# Patient Record
Sex: Female | Born: 1985 | Hispanic: Yes | Marital: Married | Smoking: Never smoker
Health system: Southern US, Community
[De-identification: ages and names within clinical notes are randomized; demographics above are authoritative.]

## PROBLEM LIST (undated history)

## (undated) ENCOUNTER — Inpatient Hospital Stay (HOSPITAL_COMMUNITY): Payer: Self-pay

## (undated) DIAGNOSIS — IMO0002 Reserved for concepts with insufficient information to code with codable children: Secondary | ICD-10-CM

## (undated) DIAGNOSIS — R7611 Nonspecific reaction to tuberculin skin test without active tuberculosis: Secondary | ICD-10-CM

## (undated) DIAGNOSIS — B159 Hepatitis A without hepatic coma: Secondary | ICD-10-CM

## (undated) DIAGNOSIS — Z8619 Personal history of other infectious and parasitic diseases: Secondary | ICD-10-CM

## (undated) HISTORY — DX: Reserved for concepts with insufficient information to code with codable children: IMO0002

## (undated) HISTORY — DX: Personal history of other infectious and parasitic diseases: Z86.19

## (undated) HISTORY — DX: Hepatitis a without hepatic coma: B15.9

## (undated) HISTORY — DX: Nonspecific reaction to tuberculin skin test without active tuberculosis: R76.11

---
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
## 2009-06-20 DIAGNOSIS — IMO0002 Reserved for concepts with insufficient information to code with codable children: Secondary | ICD-10-CM

## 2009-06-20 DIAGNOSIS — R87619 Unspecified abnormal cytological findings in specimens from cervix uteri: Secondary | ICD-10-CM

## 2009-06-20 HISTORY — DX: Unspecified abnormal cytological findings in specimens from cervix uteri: R87.619

## 2009-06-20 HISTORY — DX: Reserved for concepts with insufficient information to code with codable children: IMO0002

## 2011-05-06 ENCOUNTER — Other Ambulatory Visit: Payer: Self-pay | Admitting: Specialist

## 2011-05-06 ENCOUNTER — Ambulatory Visit
Admission: RE | Admit: 2011-05-06 | Discharge: 2011-05-06 | Disposition: A | Discharge status: Home / Self Care | Payer: No Typology Code available for payment source | Source: Ambulatory Visit | Attending: Specialist | Admitting: Specialist

## 2011-05-06 DIAGNOSIS — R6889 Other general symptoms and signs: Secondary | ICD-10-CM

## 2011-05-06 DIAGNOSIS — R7611 Nonspecific reaction to tuberculin skin test without active tuberculosis: Secondary | ICD-10-CM

## 2011-06-21 NOTE — L&D Delivery Note (Signed)
Delivery Note At 4:11 AM a viable female was delivered via Vaginal in birthing tub, Spontaneous Delivery (Presentation: ROA ). Nuchal cord x 1, delivered through and reduced after birth  APGAR: 8, 9; weight pending.  Pt assisted out of tub and into bed. Placenta status: Intact, Spontaneous by Veatrice Kells.  Cord: 3 vessels with the following complications: None.    Anesthesia: None  Episiotomy: None Lacerations: Hemostatic left labial abrasion Suture Repair: n/a Est. Blood Loss (mL): 200  Mom to postpartum.  Baby to nursery-stable.  Troyce Gieske E. 01/28/2012, 4:34 AM

## 2011-07-21 LAB — OB RESULTS CONSOLE RUBELLA ANTIBODY, IGM: Rubella: IMMUNE

## 2011-08-19 HISTORY — PX: COLPOSCOPY: SHX161

## 2011-08-29 ENCOUNTER — Other Ambulatory Visit (INDEPENDENT_AMBULATORY_CARE_PROVIDER_SITE_OTHER): Payer: BC Managed Care – PPO

## 2011-08-29 ENCOUNTER — Encounter: Payer: BC Managed Care – PPO | Admitting: Obstetrics and Gynecology

## 2011-08-29 DIAGNOSIS — Z1389 Encounter for screening for other disorder: Secondary | ICD-10-CM

## 2011-08-29 DIAGNOSIS — Z331 Pregnant state, incidental: Secondary | ICD-10-CM

## 2011-09-05 ENCOUNTER — Encounter (INDEPENDENT_AMBULATORY_CARE_PROVIDER_SITE_OTHER): Payer: BC Managed Care – PPO | Admitting: Obstetrics and Gynecology

## 2011-09-05 ENCOUNTER — Encounter (INDEPENDENT_AMBULATORY_CARE_PROVIDER_SITE_OTHER): Payer: BC Managed Care – PPO

## 2011-09-05 DIAGNOSIS — Z331 Pregnant state, incidental: Secondary | ICD-10-CM

## 2011-09-05 DIAGNOSIS — R87612 Low grade squamous intraepithelial lesion on cytologic smear of cervix (LGSIL): Secondary | ICD-10-CM

## 2011-09-26 ENCOUNTER — Other Ambulatory Visit: Payer: Self-pay

## 2011-09-26 DIAGNOSIS — Z3689 Encounter for other specified antenatal screening: Secondary | ICD-10-CM

## 2011-09-29 DIAGNOSIS — IMO0002 Reserved for concepts with insufficient information to code with codable children: Secondary | ICD-10-CM | POA: Insufficient documentation

## 2011-09-29 DIAGNOSIS — O26851 Spotting complicating pregnancy, first trimester: Secondary | ICD-10-CM

## 2011-09-29 DIAGNOSIS — N926 Irregular menstruation, unspecified: Secondary | ICD-10-CM | POA: Insufficient documentation

## 2011-09-29 DIAGNOSIS — R87619 Unspecified abnormal cytological findings in specimens from cervix uteri: Secondary | ICD-10-CM | POA: Insufficient documentation

## 2011-09-29 DIAGNOSIS — Z8659 Personal history of other mental and behavioral disorders: Secondary | ICD-10-CM

## 2011-09-30 ENCOUNTER — Ambulatory Visit (INDEPENDENT_AMBULATORY_CARE_PROVIDER_SITE_OTHER): Payer: BC Managed Care – PPO

## 2011-09-30 ENCOUNTER — Encounter: Payer: Self-pay | Admitting: Obstetrics and Gynecology

## 2011-09-30 ENCOUNTER — Other Ambulatory Visit: Payer: Self-pay | Admitting: Obstetrics and Gynecology

## 2011-09-30 ENCOUNTER — Ambulatory Visit (INDEPENDENT_AMBULATORY_CARE_PROVIDER_SITE_OTHER): Payer: BC Managed Care – PPO | Admitting: Obstetrics and Gynecology

## 2011-09-30 ENCOUNTER — Other Ambulatory Visit: Payer: BC Managed Care – PPO

## 2011-09-30 VITALS — BP 90/56 | Wt 164.0 lb

## 2011-09-30 DIAGNOSIS — Z3689 Encounter for other specified antenatal screening: Secondary | ICD-10-CM

## 2011-09-30 DIAGNOSIS — R7611 Nonspecific reaction to tuberculin skin test without active tuberculosis: Secondary | ICD-10-CM

## 2011-09-30 DIAGNOSIS — Z3400 Encounter for supervision of normal first pregnancy, unspecified trimester: Secondary | ICD-10-CM

## 2011-09-30 LAB — US OB FOLLOW UP

## 2011-09-30 NOTE — Progress Notes (Signed)
Doing well.  [place] today to complete heart views.  All WNL. Glucola NV, with HGB and RPR.   Attending waterbirth class next week. Reviewed colpo of 09/05/11. LGSIL on bx--per [facility], repeat pap in 6 months (9/13).

## 2011-10-28 ENCOUNTER — Ambulatory Visit (INDEPENDENT_AMBULATORY_CARE_PROVIDER_SITE_OTHER): Payer: BC Managed Care – PPO | Admitting: Obstetrics and Gynecology

## 2011-10-28 ENCOUNTER — Encounter: Payer: BC Managed Care – PPO | Admitting: Obstetrics and Gynecology

## 2011-10-28 ENCOUNTER — Encounter: Payer: Self-pay | Admitting: Obstetrics and Gynecology

## 2011-10-28 ENCOUNTER — Other Ambulatory Visit: Payer: BC Managed Care – PPO

## 2011-10-28 VITALS — BP 90/62 | Ht 62.0 in | Wt 168.0 lb

## 2011-10-28 DIAGNOSIS — IMO0002 Reserved for concepts with insufficient information to code with codable children: Secondary | ICD-10-CM

## 2011-10-28 DIAGNOSIS — R6889 Other general symptoms and signs: Secondary | ICD-10-CM

## 2011-10-28 DIAGNOSIS — Z3490 Encounter for supervision of normal pregnancy, unspecified, unspecified trimester: Secondary | ICD-10-CM

## 2011-10-28 LAB — CBC
HCT: 34.8 % — ABNORMAL LOW (ref 36.0–46.0)
MCH: 30.1 pg (ref 26.0–34.0)
MCHC: 32.8 g/dL (ref 30.0–36.0)
MCV: 91.8 fL (ref 78.0–100.0)
RDW: 12.9 % (ref 11.5–15.5)

## 2011-10-28 NOTE — Progress Notes (Signed)
No complaints except occ hip discomfort. Glucola today  B+ FKCs RTO 2wks

## 2011-11-11 ENCOUNTER — Ambulatory Visit (INDEPENDENT_AMBULATORY_CARE_PROVIDER_SITE_OTHER): Payer: BC Managed Care – PPO | Admitting: Obstetrics and Gynecology

## 2011-11-11 VITALS — BP 102/60 | Wt 170.0 lb

## 2011-11-11 DIAGNOSIS — Z3490 Encounter for supervision of normal pregnancy, unspecified, unspecified trimester: Secondary | ICD-10-CM

## 2011-11-11 NOTE — Progress Notes (Signed)
No complaints Reviewed and negative GCT Fetal kick count reviewed Patient inquiring about delivery plan and is interested in a water birth. Next appointment in 2 weeks with midwife

## 2011-11-11 NOTE — Progress Notes (Signed)
Pt has no complaints/ JM  

## 2011-11-21 DIAGNOSIS — R7611 Nonspecific reaction to tuberculin skin test without active tuberculosis: Secondary | ICD-10-CM

## 2011-11-21 DIAGNOSIS — Z3400 Encounter for supervision of normal first pregnancy, unspecified trimester: Secondary | ICD-10-CM

## 2011-11-24 ENCOUNTER — Encounter: Payer: BC Managed Care – PPO | Admitting: Obstetrics and Gynecology

## 2011-11-25 ENCOUNTER — Ambulatory Visit (INDEPENDENT_AMBULATORY_CARE_PROVIDER_SITE_OTHER): Payer: BC Managed Care – PPO | Admitting: Obstetrics and Gynecology

## 2011-11-25 VITALS — BP 100/60 | Wt 174.0 lb

## 2011-11-25 DIAGNOSIS — Z331 Pregnant state, incidental: Secondary | ICD-10-CM

## 2011-11-25 NOTE — Progress Notes (Signed)
Doing well.  Return office in 2 weeks. 

## 2011-11-25 NOTE — Progress Notes (Signed)
Pt states she has no concerns today. [facility] CMA

## 2011-12-12 ENCOUNTER — Ambulatory Visit (INDEPENDENT_AMBULATORY_CARE_PROVIDER_SITE_OTHER): Payer: BC Managed Care – PPO | Admitting: Obstetrics and Gynecology

## 2011-12-12 ENCOUNTER — Encounter: Payer: Self-pay | Admitting: Obstetrics and Gynecology

## 2011-12-12 VITALS — BP 90/64 | Wt 174.5 lb

## 2011-12-12 DIAGNOSIS — N949 Unspecified condition associated with female genital organs and menstrual cycle: Secondary | ICD-10-CM

## 2011-12-12 DIAGNOSIS — O26899 Other specified pregnancy related conditions, unspecified trimester: Secondary | ICD-10-CM

## 2011-12-12 DIAGNOSIS — O9989 Other specified diseases and conditions complicating pregnancy, childbirth and the puerperium: Secondary | ICD-10-CM

## 2011-12-12 DIAGNOSIS — Z331 Pregnant state, incidental: Secondary | ICD-10-CM

## 2011-12-12 LAB — POCT URINALYSIS DIPSTICK
Spec Grav, UA: <=1.005
Urobilinogen, UA: 1
pH, UA: 7

## 2011-12-12 NOTE — Progress Notes (Signed)
Doing well. Labor discussed. Return office in 2 weeks. Dr. Stefano Gaul

## 2011-12-30 ENCOUNTER — Ambulatory Visit (INDEPENDENT_AMBULATORY_CARE_PROVIDER_SITE_OTHER): Payer: BC Managed Care – PPO | Admitting: Obstetrics and Gynecology

## 2011-12-30 ENCOUNTER — Encounter: Payer: Self-pay | Admitting: Obstetrics and Gynecology

## 2011-12-30 VITALS — BP 102/60 | Wt 178.0 lb

## 2011-12-30 DIAGNOSIS — Z331 Pregnant state, incidental: Secondary | ICD-10-CM

## 2011-12-30 NOTE — Progress Notes (Signed)
No complaints. GBS today.  Declined GC/CT. Repeat pap PP Questions answered FKCs and Labor Precautions Desires waterbirth

## 2012-01-05 ENCOUNTER — Ambulatory Visit (INDEPENDENT_AMBULATORY_CARE_PROVIDER_SITE_OTHER): Payer: BC Managed Care – PPO | Admitting: Obstetrics and Gynecology

## 2012-01-05 ENCOUNTER — Encounter: Payer: Self-pay | Admitting: Obstetrics and Gynecology

## 2012-01-05 VITALS — BP 100/66 | Wt 180.0 lb

## 2012-01-05 DIAGNOSIS — E119 Type 2 diabetes mellitus without complications: Secondary | ICD-10-CM

## 2012-01-05 NOTE — Progress Notes (Signed)
No concerns today 

## 2012-01-05 NOTE — Progress Notes (Signed)
Doing well--still plans waterbirth. Vtx to [place]. Reviewed GBS negative.

## 2012-01-13 ENCOUNTER — Ambulatory Visit (INDEPENDENT_AMBULATORY_CARE_PROVIDER_SITE_OTHER): Payer: BC Managed Care – PPO | Admitting: Obstetrics and Gynecology

## 2012-01-13 ENCOUNTER — Encounter: Payer: Self-pay | Admitting: Obstetrics and Gynecology

## 2012-01-13 VITALS — BP 100/60 | Wt 183.0 lb

## 2012-01-13 DIAGNOSIS — Z3480 Encounter for supervision of other normal pregnancy, unspecified trimester: Secondary | ICD-10-CM

## 2012-01-13 NOTE — Patient Instructions (Signed)

## 2012-01-13 NOTE — Progress Notes (Signed)
Pt declined cervix check

## 2012-01-13 NOTE — Progress Notes (Signed)
A/P GBS negative Fetal kick counts reviewed Labor reviewed with pt All patients  questions answered 

## 2012-01-20 ENCOUNTER — Encounter: Payer: Self-pay | Admitting: Obstetrics and Gynecology

## 2012-01-20 ENCOUNTER — Ambulatory Visit (INDEPENDENT_AMBULATORY_CARE_PROVIDER_SITE_OTHER): Payer: BC Managed Care – PPO | Admitting: Obstetrics and Gynecology

## 2012-01-20 VITALS — BP 98/60 | Wt 184.0 lb

## 2012-01-20 DIAGNOSIS — R6889 Other general symptoms and signs: Secondary | ICD-10-CM

## 2012-01-20 DIAGNOSIS — IMO0002 Reserved for concepts with insufficient information to code with codable children: Secondary | ICD-10-CM

## 2012-01-20 NOTE — Progress Notes (Signed)
Pt lost mucous plug on Sunday. cx check

## 2012-01-20 NOTE — Progress Notes (Signed)
Planning waterbirth. Cervix FT, 50%, vtx, -1 Slightly narrow outlet noted. Labor s/s reviewed. Needs repeat pap in September 2013 per [facility] plan (hx colpo 3/13).

## 2012-01-27 ENCOUNTER — Encounter (HOSPITAL_COMMUNITY): Payer: Self-pay | Admitting: *Deleted

## 2012-01-27 ENCOUNTER — Inpatient Hospital Stay (HOSPITAL_COMMUNITY)
Admission: AD | Admit: 2012-01-27 | Discharge: 2012-01-30 | DRG: 373 | Disposition: A | Discharge status: Home / Self Care | Payer: BC Managed Care – PPO | Source: Ambulatory Visit | Attending: Obstetrics and Gynecology | Admitting: Obstetrics and Gynecology

## 2012-01-27 ENCOUNTER — Encounter: Payer: Self-pay | Admitting: Obstetrics and Gynecology

## 2012-01-27 ENCOUNTER — Ambulatory Visit (INDEPENDENT_AMBULATORY_CARE_PROVIDER_SITE_OTHER): Payer: BC Managed Care – PPO | Admitting: Obstetrics and Gynecology

## 2012-01-27 ENCOUNTER — Ambulatory Visit (INDEPENDENT_AMBULATORY_CARE_PROVIDER_SITE_OTHER): Payer: BC Managed Care – PPO

## 2012-01-27 VITALS — BP 98/66 | Wt 183.0 lb

## 2012-01-27 DIAGNOSIS — O480 Post-term pregnancy: Secondary | ICD-10-CM

## 2012-01-27 DIAGNOSIS — Z3400 Encounter for supervision of normal first pregnancy, unspecified trimester: Secondary | ICD-10-CM

## 2012-01-27 DIAGNOSIS — O368190 Decreased fetal movements, unspecified trimester, not applicable or unspecified: Secondary | ICD-10-CM

## 2012-01-27 DIAGNOSIS — R7611 Nonspecific reaction to tuberculin skin test without active tuberculosis: Secondary | ICD-10-CM

## 2012-01-27 LAB — CBC
Hemoglobin: 12.1 g/dL (ref 12.0–15.0)
MCH: 27.9 pg (ref 26.0–34.0)
MCHC: 33.2 g/dL (ref 30.0–36.0)
MCV: 84.3 fL (ref 78.0–100.0)
RBC: 4.33 MIL/uL (ref 3.87–5.11)

## 2012-01-27 LAB — OB RESULTS CONSOLE HIV ANTIBODY (ROUTINE TESTING): HIV: NONREACTIVE

## 2012-01-27 LAB — OB RESULTS CONSOLE ABO/RH: RH Type: POSITIVE

## 2012-01-27 LAB — OB RESULTS CONSOLE GC/CHLAMYDIA: Chlamydia: NEGATIVE

## 2012-01-27 LAB — OB RESULTS CONSOLE RPR: RPR: NONREACTIVE

## 2012-01-27 LAB — OB RESULTS CONSOLE HEPATITIS B SURFACE ANTIGEN: Hepatitis B Surface Ag: NEGATIVE

## 2012-01-27 MED ORDER — LACTATED RINGERS IV SOLN
500.0000 mL | INTRAVENOUS | Status: DC | PRN
  Administered 2012-01-28: 125 mL via INTRAVENOUS

## 2012-01-27 MED ORDER — CITRIC ACID-SODIUM CITRATE 334-500 MG/5ML PO SOLN: 30.0000 mL | ORAL | Status: DC | PRN

## 2012-01-27 MED ORDER — LIDOCAINE HCL (PF) 1 % IJ SOLN: 30.0000 mL | INTRAMUSCULAR | Status: DC | PRN

## 2012-01-27 MED ORDER — IBUPROFEN 600 MG PO TABS: 600.0000 mg | ORAL_TABLET | Freq: Four times a day (QID) | ORAL | Status: DC | PRN

## 2012-01-27 MED ORDER — ONDANSETRON HCL 4 MG/2ML IJ SOLN
4.0000 mg | Freq: Four times a day (QID) | INTRAMUSCULAR | Status: DC | PRN
  Administered 2012-01-28: 4 mg via INTRAVENOUS
  Filled 2012-01-27: qty 2

## 2012-01-27 MED ORDER — OXYCODONE-ACETAMINOPHEN 5-325 MG PO TABS: 1.0000 | ORAL_TABLET | ORAL | Status: DC | PRN

## 2012-01-27 MED ORDER — FLEET ENEMA 7-19 GM/118ML RE ENEM: 1.0000 | ENEMA | RECTAL | Status: DC | PRN

## 2012-01-27 MED ORDER — OXYTOCIN 40 UNITS IN LACTATED RINGERS INFUSION - SIMPLE MED
62.5000 mL/h | Freq: Once | INTRAVENOUS | Status: DC
  Filled 2012-01-27: qty 1000

## 2012-01-27 MED ORDER — OXYTOCIN BOLUS FROM INFUSION
250.0000 mL | Freq: Once | INTRAVENOUS | Status: DC
  Filled 2012-01-27: qty 500

## 2012-01-27 MED ORDER — ACETAMINOPHEN 325 MG PO TABS: 650.0000 mg | ORAL_TABLET | ORAL | Status: DC | PRN

## 2012-01-27 MED ORDER — NALBUPHINE SYRINGE 5 MG/0.5 ML: 5.0000 mg | INJECTION | INTRAMUSCULAR | Status: DC | PRN

## 2012-01-27 NOTE — H&P (Signed)
Isabella Watkins is a 26 y.o. female presenting for SROM at 1330, uc since 0600, denies vag bleeding, with +FM. History OB History    Grav Para Term Preterm Abortions TAB SAB Ect Mult Living   1              Past Medical History  Diagnosis Date  . H/O varicella   . H/O rubella   . Abnormal Pap smear 2011  . Hepatitis A 2000    after trip to [place]  . History of positive PPD, treatment status unknown   HX LSIL plan f/o pap pp Past Surgical History  Procedure Date  . Colposcopy 3-13   Family History: family history includes Anemia in her mother and COPD in her paternal grandfather. Social History:  reports that she has never smoked. She has never used smokeless tobacco. She reports that she does not drink alcohol or use illicit drugs.   Prenatal Transfer Tool  Maternal Diabetes: No Genetic Screening: Declined Maternal Ultrasounds/Referrals: Normal at 19 weeks posterior placenta Fetal Ultrasounds or other Referrals:  None Maternal Substance Abuse:  No Significant Maternal Medications:  None Significant Maternal Lab Results:  None Other Comments:  None  ROS    Last menstrual period 05/21/2011. Exam Physical Exam Calm, no distress, HEENT WNL grossly, lungs clear bilaterally, AP RRR, abd soft nt,no masses, not tympanic bowel sounds active, abdomen nontender, No edema lower legs Vag 3 80 -2 VTX +fern at office at 1530 appointment with D. Connye Burkitt, CNM  BPP 8/8  fhts LTV min uc q 3-5 mild to mod Prenatal labs: ABO, Rh:  B post Antibody:  neg Rubella:  NR RPR: NON REAC (05/10 1244)  HBsAg:   NR HIV:   neg GBS: NEGATIVE (07/12 1413)   Assessment/Plan: [redacted]w[redacted]d Routine admission, water birth, intermittent FHR, collaboration with Dr. Pennie Rushing.   Isabella Watkins 01/27/2012, 5:44 PM

## 2012-01-27 NOTE — Progress Notes (Signed)
  Subjective: Has been in tub since arrival on [employer]. Reports UCs are strong when they occur, but she is unsure of frequency.  Objective: BP 122/69  Pulse 89  Temp 98.2 F (36.8 C) (Oral)  Resp 18  Ht 5\' 2"  (1.575 m)  Wt 183 lb (83.008 kg)  BMI 33.47 kg/m2  LMP 05/21/2011      FHT:  Non-reactive at present, but no decels.  Had segment of reactivity after admission, and occasional scattered accels.  One episode of 2-3 mild variables around 6:40pm UC:   irregular, every 4- minutes SVE:      Labs: Lab Results  Component Value Date   WBC 10.6* 01/27/2012   HGB 12.1 01/27/2012   HCT 36.5 01/27/2012   MCV 84.3 01/27/2012   PLT 260 01/27/2012    Assessment / Plan: SROM at 1:30pm Early labor Negative GBS Plans waterbirth  Plan: Continuous monitoring Recommend out of tub and ambulation for 1 hour, then re-evaluate.  May need augmentation. Dr. Pennie Rushing updated.    Nigel Bridgeman 01/27/2012, 8:23 PM

## 2012-01-27 NOTE — Progress Notes (Signed)
Pt states having contractions since this AM every 10 mins lasting 30 secs each and increased mucous

## 2012-01-27 NOTE — Progress Notes (Signed)
[redacted]w[redacted]d NST this morning for decreased fetal movement. NST borderline remains Cat 1 variability min - mod. BPP scheduled for PM Patient returned at 3pm for BPP. States that had ? SROM at 1.30 pm and has started contracting BPP 8/8, low fluid volume. Called [facility]- transfer to hospital. Report given on patient status.  Speculum examination: Pooling, Ferning: Pos+

## 2012-01-27 NOTE — Progress Notes (Signed)
Manfred Arch, CNM at bedside, plan of care discussed with pt and pt's family

## 2012-01-28 ENCOUNTER — Encounter (HOSPITAL_COMMUNITY): Payer: Self-pay | Admitting: *Deleted

## 2012-01-28 MED ORDER — TETANUS-DIPHTH-ACELL PERTUSSIS 5-2.5-18.5 LF-MCG/0.5 IM SUSP: 0.5000 mL | Freq: Once | INTRAMUSCULAR | Status: DC

## 2012-01-28 MED ORDER — ONDANSETRON HCL 4 MG/2ML IJ SOLN: 4.0000 mg | INTRAMUSCULAR | Status: DC | PRN

## 2012-01-28 MED ORDER — OXYTOCIN 40 UNITS IN LACTATED RINGERS INFUSION - SIMPLE MED
1.0000 m[IU]/min | INTRAVENOUS | Status: DC
  Administered 2012-01-28: 1 m[IU]/min via INTRAVENOUS

## 2012-01-28 MED ORDER — ONDANSETRON HCL 4 MG PO TABS: 4.0000 mg | ORAL_TABLET | ORAL | Status: DC | PRN

## 2012-01-28 MED ORDER — TERBUTALINE SULFATE 1 MG/ML IJ SOLN: 0.2500 mg | Freq: Once | INTRAMUSCULAR | Status: DC | PRN

## 2012-01-28 MED ORDER — SENNOSIDES-DOCUSATE SODIUM 8.6-50 MG PO TABS: 2.0000 | ORAL_TABLET | Freq: Every day | ORAL | Status: DC

## 2012-01-28 MED ORDER — WITCH HAZEL-GLYCERIN EX PADS: 1.0000 "application " | MEDICATED_PAD | CUTANEOUS | Status: DC | PRN

## 2012-01-28 MED ORDER — ZOLPIDEM TARTRATE 5 MG PO TABS: 5.0000 mg | ORAL_TABLET | Freq: Every evening | ORAL | Status: DC | PRN

## 2012-01-28 MED ORDER — PRENATAL MULTIVITAMIN CH
1.0000 | ORAL_TABLET | Freq: Every day | ORAL | Status: DC
  Administered 2012-01-28 – 2012-01-30 (×3): 1 via ORAL
  Filled 2012-01-28 (×3): qty 1

## 2012-01-28 MED ORDER — IBUPROFEN 600 MG PO TABS
600.0000 mg | ORAL_TABLET | Freq: Four times a day (QID) | ORAL | Status: DC
  Administered 2012-01-28 – 2012-01-30 (×10): 600 mg via ORAL
  Filled 2012-01-28 (×10): qty 1

## 2012-01-28 MED ORDER — SIMETHICONE 80 MG PO CHEW: 80.0000 mg | CHEWABLE_TABLET | ORAL | Status: DC | PRN

## 2012-01-28 MED ORDER — DIBUCAINE 1 % RE OINT
1.0000 "application " | TOPICAL_OINTMENT | RECTAL | Status: DC | PRN
  Filled 2012-01-28: qty 28

## 2012-01-28 MED ORDER — OXYCODONE-ACETAMINOPHEN 5-325 MG PO TABS: 1.0000 | ORAL_TABLET | ORAL | Status: DC | PRN

## 2012-01-28 MED ORDER — BUTORPHANOL TARTRATE 1 MG/ML IJ SOLN
1.0000 mg | INTRAMUSCULAR | Status: DC | PRN
  Administered 2012-01-28: 1 mg via INTRAVENOUS

## 2012-01-28 MED ORDER — DIPHENHYDRAMINE HCL 25 MG PO CAPS: 25.0000 mg | ORAL_CAPSULE | Freq: Four times a day (QID) | ORAL | Status: DC | PRN

## 2012-01-28 MED ORDER — LANOLIN HYDROUS EX OINT: TOPICAL_OINTMENT | CUTANEOUS | Status: DC | PRN

## 2012-01-28 MED ORDER — BENZOCAINE-MENTHOL 20-0.5 % EX AERO
1.0000 "application " | INHALATION_SPRAY | CUTANEOUS | Status: DC | PRN
  Filled 2012-01-28 (×2): qty 56

## 2012-01-28 MED ORDER — BUTORPHANOL TARTRATE 1 MG/ML IJ SOLN
INTRAMUSCULAR | Status: AC
  Filled 2012-01-28: qty 1

## 2012-01-28 NOTE — Progress Notes (Signed)
Transferred to 142 via wheelchair with infant and family members

## 2012-01-28 NOTE — Progress Notes (Signed)
  Subjective: Minimal benefit from Stadol.   Objective: BP 118/68  Pulse 74  Temp 97.8 F (36.6 C) (Axillary)  Resp 19  Ht 5\' 2"  (1.575 m)  Wt 183 lb (83.008 kg)  BMI 33.47 kg/m2  LMP 05/21/2011      FHT:  Reassuring, occasional quick variable. UC:   regular, every 3 minutes SVE:  Cervix 6 cm, 100%, vtx, -1--still narrow outlet.   Assessment / Plan: Slow labor progression. Recommended pitocin augmentation, with pain management prn. Patient agreeable with pitocin, desires return to tub for pain management.   Furman Trentman 01/28/2012, 2:11 AM

## 2012-01-28 NOTE — Progress Notes (Signed)
  Subjective: Crying with contractions, feeling pressure.  Currently in tub.  Objective: BP 118/75  Pulse 75  Temp 98.2 F (36.8 C) (Oral)  Resp 20  Ht 5\' 2"  (1.575 m)  Wt 183 lb (83.008 kg)  BMI 33.47 kg/m2  LMP 05/21/2011      FHT: Reassuring, occasional quick, mild variables UC:   Difficult to trace with telemetry monitor, but q 3 min by observation, moderate quality. SVE:   Dilation: 4 Effacement (%): 100 Station: -1 Exam by:: Manfred Arch, CNM No change from last exam. ? Rigidity of cervical tissue    Assessment / Plan: Prolonged prodromal phase of labor Probable inadequate contractions.  Plan: Reviewed status with patient and family. Recommend IV pain medication now, with re-evaluation in 1-2 hours.  If no progress, will recommend pitocin augmentation, with epidural as option for pain management.   Nigel Bridgeman 01/28/2012, 12:43 AM

## 2012-01-29 NOTE — Progress Notes (Signed)
Post Partum Day 1 Subjective: no complaints, up ad lib, voiding, tolerating PO, + flatus and BF'ng well so far; desires inpatient circ.  Pt sleeping on my arrival to room and s.o. in bedside asleep beside her.VB lighter today. Reports "sore" in places she didn't anticipate being sore from labor & birth.  Objective: Blood pressure 112/71, pulse 82, temperature 97.6 F (36.4 C), temperature source Oral, resp. rate 18, height 5\' 2"  (1.575 m), weight 183 lb (83.008 kg), last menstrual period 05/21/2011, SpO2 97.00%, unknown if currently breastfeeding.  Physical Exam:  General: alert, cooperative, fatigued and no distress Lochia: appropriate, rubra Uterine Fundus: firm, u/-1 Incision: n/a DVT Evaluation: No evidence of DVT seen on physical exam. Negative Homan's sign.   Basename 01/27/12 1755  HGB 12.1  HCT 36.5    Assessment/Plan: Plan for discharge tomorrow, Breastfeeding and Circumcision prior to discharge S/p waterbirth.     LOS: 2 days   Isabella Watkins H 01/29/2012, 10:35 AM

## 2012-01-30 MED ORDER — IBUPROFEN 600 MG PO TABS: 600.0000 mg | ORAL_TABLET | Freq: Four times a day (QID) | ORAL | Status: AC | PRN

## 2012-01-30 NOTE — Discharge Summary (Signed)
Obstetric Discharge Summary Reason for Admission: rupture of membranes, early labor Prenatal Procedures: NST and ultrasound Intrapartum Procedures: spontaneous vaginal delivery, waterbirth, pitocin augmentation Postpartum Procedures: none Complications-Operative and Postpartum: none Hemoglobin  Date Value Range Status  01/27/2012 12.1  12.0 - 15.0 g/dL Final     HCT  Date Value Range Status  01/27/2012 36.5  36.0 - 46.0 % Final   Hospital Course: Admitted 01/27/12 with SROM. Negative GBS. Planned waterbirth, and utilized tub at intervals during labor.  Pitocin augmentation was initiated for slow progress in labor, and patient utilized IV pain medication twice for pain management. Labor then progressed well, with delivery was performed by Maylon Cos, CNM, in stand-by for Nigel Bridgeman, CNM, who was in another delivery at the same time. Patient delivered without complication. Patient and baby tolerated the procedure without difficulty, with  no laceration noted--she had a small abrasion of the left labia that did not require repair. Infant to FTN. Mother and infant then had an uncomplicated postpartum course, with breast feeding going well. Mom's physical exam was WNL, and she was discharged home in stable condition on 01/30/12.Marland Kitchen Contraception plan was undecided at the time of discharge.  She received adequate benefit from po pain medications and was using Motrin only with benefit.    Physical Exam:  General: alert Lochia: appropriate Uterine Fundus: firm Incision: No lacerations noted--small abrasion of left labia, but no repair required. DVT Evaluation: No evidence of DVT seen on physical exam. Negative Homan's sign.  Discharge Diagnoses: Term Pregnancy-delivered, waterbirth  Discharge Information: Date: 01/30/2012 Activity: Per [facility] handout Diet: routine Medications: Ibuprofen Condition: stable Instructions: refer to practice specific booklet Discharge to: home Follow-up Information     Follow up with [facility] in 6 weeks. (Call to schedule appointment or as needed)          Newborn Data: Live born female  Birth Weight: 6 lb 6.3 oz (2900 g) APGAR: 8, 9  Home with mother.  Nigel Bridgeman 01/30/2012, 10:57 AM

## 2012-02-03 ENCOUNTER — Telehealth: Payer: Self-pay | Admitting: Obstetrics and Gynecology

## 2012-02-03 NOTE — Telephone Encounter (Signed)
Spoke with pt del 01/27/12 rash on abdomen spreading to thighs tried hydrocortizone and benadryl no relief offered appt pt has appt 02/06/12 at 10:00 with Vl pt voice understanding

## 2012-02-06 ENCOUNTER — Encounter: Payer: Self-pay | Admitting: Obstetrics and Gynecology

## 2012-02-06 ENCOUNTER — Ambulatory Visit (INDEPENDENT_AMBULATORY_CARE_PROVIDER_SITE_OTHER): Payer: BC Managed Care – PPO | Admitting: Obstetrics and Gynecology

## 2012-02-06 ENCOUNTER — Other Ambulatory Visit: Payer: Self-pay | Admitting: Obstetrics and Gynecology

## 2012-02-06 VITALS — BP 100/66 | Resp 16 | Wt 173.0 lb

## 2012-02-06 DIAGNOSIS — L259 Unspecified contact dermatitis, unspecified cause: Secondary | ICD-10-CM

## 2012-02-06 DIAGNOSIS — L309 Dermatitis, unspecified: Secondary | ICD-10-CM | POA: Insufficient documentation

## 2012-02-06 NOTE — Progress Notes (Signed)
C/O onset itchy rash since delivery on 01/27/11.   First noted in stretch marks on abdomen, but now has extended to inner thighs, upper arms, breasts. Has used Benadryl and hydrocortisone cream with some relief.  No known dermatological exposures, no current meds. No fever or viral symptoms. No one else in family with similar sx.  PE: Erythematous papular rash in striae on abdomen, in patches between thighs, and scattered on breasts. No pustules or drainage noted.  Plan: Rx Medrol 4 mg dose pak over 6 days Triamcinolone 0.025% cream to area BID (Rxs called to patiient's pharmacy) Office will call patient for update on Thursday--if no improvement, will send to dermatologist.

## 2012-02-06 NOTE — Progress Notes (Signed)
Pt states every since vag. delivery 01/27/12 c/o itchy rash on abdominal area spreading down to both legs. Pt states has short term relief with hydrocortisone & Benadryl

## 2012-02-06 NOTE — Telephone Encounter (Signed)
Tc to pt regarding msg.  Advised pt to call pharmacy again to see if rx is there.  Per VL note rx was called in today.  Pt advised to call back if not there.  Pt voices understanding.

## 2012-02-08 ENCOUNTER — Telehealth: Payer: Self-pay

## 2012-02-08 NOTE — Telephone Encounter (Signed)
Message copied by Janeece Agee on Wed Feb 08, 2012  4:39 PM ------      Message from: Cornelius Moras      Created: Mon Feb 06, 2012  2:07 PM      Regarding: F/U call on Thursday       Please call patient on Thursday this week and check on the status of her rash.  If no improvement, needs referral to dermatologist ASAP.            Thanks!  See me if you have questions that day.            VL

## 2012-02-08 NOTE — Telephone Encounter (Signed)
Spoke with pt to follow up with her rash. Pt states she has 3 more days worth of pills left and the rash is improving. Informed pt to c/b if her sx's reverse or if she has any other concerns. Pt agrees and voices understanding.

## 2012-03-16 ENCOUNTER — Ambulatory Visit (INDEPENDENT_AMBULATORY_CARE_PROVIDER_SITE_OTHER): Payer: BC Managed Care – PPO | Admitting: Obstetrics and Gynecology

## 2012-03-16 ENCOUNTER — Encounter: Payer: Self-pay | Admitting: Obstetrics and Gynecology

## 2012-03-16 VITALS — BP 104/60 | Temp 97.7°F | Resp 16 | Ht 62.0 in | Wt 163.0 lb

## 2012-03-16 DIAGNOSIS — IMO0002 Reserved for concepts with insufficient information to code with codable children: Secondary | ICD-10-CM

## 2012-03-16 DIAGNOSIS — IMO0001 Reserved for inherently not codable concepts without codable children: Secondary | ICD-10-CM

## 2012-03-16 DIAGNOSIS — Z309 Encounter for contraceptive management, unspecified: Secondary | ICD-10-CM

## 2012-03-16 DIAGNOSIS — R87612 Low grade squamous intraepithelial lesion on cytologic smear of cervix (LGSIL): Secondary | ICD-10-CM

## 2012-03-16 MED ORDER — NORETHINDRONE 0.35 MG PO TABS: 1.0000 | ORAL_TABLET | Freq: Every day | ORAL | Status: DC

## 2012-03-16 NOTE — Progress Notes (Signed)
Date of delivery 01/28/2012 Female Name: Isabella Watkins Vaginal delivery:yes Cesarean section:no Tubal ligation:no GDM:no Breast Feeding:yes Bottle Feeding:no Post-Partum Blues:no Abnormal pap:yes needs repeat pap today. Colpo  In 08/2011 showed LGSIL Normal GU function: yes Normal GI function:yes Returning to work:no  Reports pain with BM only.  Reviewed BC options.  Filed Vitals:   03/16/12 1121  BP: 104/60  Temp: 97.7 F (36.5 C)  Resp: 16   ROS: noncontributory  Pelvic exam:  VULVA: normal appearing vulva with no masses, tenderness or lesions,  VAGINA: normal appearing vagina with normal color and discharge, no lesions, CERVIX: normal appearing cervix without discharge or lesions,  UTERUS: uterus is normal size, shape, consistency and nontender,  ADNEXA: normal adnexa in size, nontender and no masses.  A/P Pap today  Deciding on [facility] - decided on micronor (still BF) - Rx sent Rec stool softeners and obs over next couple of mths and if persists return to office for eval - rectal not spec done bc pt mentioned it after the exam and was hesitant about having it done today. Rpt pap and AEX in 

## 2012-03-20 LAB — PAP IG W/ RFLX HPV ASCU

## 2013-02-21 LAB — OB RESULTS CONSOLE ABO/RH: RH Type: POSITIVE

## 2013-02-21 LAB — OB RESULTS CONSOLE HEPATITIS B SURFACE ANTIGEN: HEP B S AG: NEGATIVE

## 2013-02-21 LAB — OB RESULTS CONSOLE HIV ANTIBODY (ROUTINE TESTING): HIV: NONREACTIVE

## 2013-02-21 LAB — OB RESULTS CONSOLE GC/CHLAMYDIA
CHLAMYDIA, DNA PROBE: NEGATIVE
GC PROBE AMP, GENITAL: NEGATIVE

## 2013-02-21 LAB — OB RESULTS CONSOLE RPR: RPR: NONREACTIVE

## 2013-02-21 LAB — OB RESULTS CONSOLE ANTIBODY SCREEN: ANTIBODY SCREEN: NEGATIVE

## 2013-02-21 LAB — OB RESULTS CONSOLE RUBELLA ANTIBODY, IGM: RUBELLA: IMMUNE

## 2013-06-20 NOTE — L&D Delivery Note (Signed)
Delivery Note  Pt had SROM for lg amt clear fluid at 2041 while in MAU, VE had been 3cm, pt then ambulated to L&D and upon arrival began to have urge to push, pt on hands/knees and vtx crowning when I arrived in room, FHR reassuring   At 10:02 PM a viable female was delivered via Vaginal, Spontaneous Delivery (Presentation: Right Occiput Anterior).  w compound L hand presentation, shoulders delivered easily, infant dried and placed on mom's abdomen, APGAR: 8, 9; weight 6 lb 13.2 oz (3096 g).   Placenta status: Intact, Expressed.  Cord: 3 vessels with the following complications: None.  Cord pH: n/a  Anesthesia: None  Episiotomy: None Lacerations: sm L periurethral laceration hemostatic not requiring repair  Suture Repair: n/a Est. Blood Loss (mL): 200  Mom to postpartum.  Baby to Couplet care / Skin to Skin. Pt plans to BF Desires inpatient circumcision  Routine PP orders except for will cancel AM CBC unless increased bleeding or symptomatic, (minimal blood loss and CBC not drawn until about 11pm, after delivery)   Malissa HippoShelley M Jinx Gilden 09/29/2013, 7:17 AM

## 2013-08-28 LAB — OB RESULTS CONSOLE GBS: GBS: NEGATIVE

## 2013-09-28 ENCOUNTER — Inpatient Hospital Stay (HOSPITAL_COMMUNITY)
Admission: AD | Admit: 2013-09-28 | Discharge: 2013-09-30 | DRG: 775 | Disposition: A | Discharge status: Home / Self Care | Payer: BC Managed Care – PPO | Source: Ambulatory Visit | Attending: Obstetrics and Gynecology | Admitting: Obstetrics and Gynecology

## 2013-09-28 ENCOUNTER — Encounter (HOSPITAL_COMMUNITY): Payer: Self-pay | Admitting: *Deleted

## 2013-09-28 DIAGNOSIS — Z91040 Latex allergy status: Secondary | ICD-10-CM | POA: Diagnosis present

## 2013-09-28 DIAGNOSIS — Z9289 Personal history of other medical treatment: Secondary | ICD-10-CM | POA: Diagnosis not present

## 2013-09-28 DIAGNOSIS — Z886 Allergy status to analgesic agent status: Secondary | ICD-10-CM

## 2013-09-28 DIAGNOSIS — O328XX0 Maternal care for other malpresentation of fetus, not applicable or unspecified: Secondary | ICD-10-CM | POA: Diagnosis present

## 2013-09-28 DIAGNOSIS — Z8669 Personal history of other diseases of the nervous system and sense organs: Secondary | ICD-10-CM

## 2013-09-28 LAB — CBC
HEMATOCRIT: 33.5 % — AB (ref 36.0–46.0)
Hemoglobin: 11.2 g/dL — ABNORMAL LOW (ref 12.0–15.0)
MCH: 27.4 pg (ref 26.0–34.0)
MCHC: 33.4 g/dL (ref 30.0–36.0)
MCV: 81.9 fL (ref 78.0–100.0)
Platelets: 228 10*3/uL (ref 150–400)
RBC: 4.09 MIL/uL (ref 3.87–5.11)
RDW: 14.8 % (ref 11.5–15.5)
WBC: 12 10*3/uL — ABNORMAL HIGH (ref 4.0–10.5)

## 2013-09-28 MED ORDER — ACETAMINOPHEN 325 MG PO TABS: 650.0000 mg | ORAL_TABLET | ORAL | Status: DC | PRN

## 2013-09-28 MED ORDER — CITRIC ACID-SODIUM CITRATE 334-500 MG/5ML PO SOLN: 30.0000 mL | ORAL | Status: DC | PRN

## 2013-09-28 MED ORDER — OXYTOCIN 10 UNIT/ML IJ SOLN: 10.0000 [IU] | Freq: Once | INTRAMUSCULAR | Status: DC

## 2013-09-28 MED ORDER — OXYTOCIN 10 UNIT/ML IJ SOLN
INTRAMUSCULAR | Status: AC
  Filled 2013-09-28: qty 1

## 2013-09-28 MED ORDER — LIDOCAINE HCL (PF) 1 % IJ SOLN
30.0000 mL | INTRAMUSCULAR | Status: DC | PRN
  Filled 2013-09-28: qty 30

## 2013-09-28 MED ORDER — OXYCODONE-ACETAMINOPHEN 5-325 MG PO TABS: 1.0000 | ORAL_TABLET | ORAL | Status: DC | PRN

## 2013-09-28 MED ORDER — ZOLPIDEM TARTRATE 10 MG PO TABS: 10.0000 mg | ORAL_TABLET | Freq: Every evening | ORAL | Status: DC | PRN

## 2013-09-28 MED ORDER — ONDANSETRON HCL 4 MG/2ML IJ SOLN: 4.0000 mg | Freq: Four times a day (QID) | INTRAMUSCULAR | Status: DC | PRN

## 2013-09-28 MED ORDER — LIDOCAINE HCL (PF) 1 % IJ SOLN
INTRAMUSCULAR | Status: AC
  Filled 2013-09-28: qty 30

## 2013-09-28 NOTE — MAU Provider Note (Signed)
History   28 yo G2P1001 at 40 1/7 weeks presented after calling to report UCs since 1am, with increase in intensity last 1-2 hours.  Reports +FM and small amount bloody show, no leaking.  No recent cervical exam.  Planning wateribirth--had waterbirth 01/2012.  Accompanied by husband and doula, Delfin EdisJessica Pace, and doula trainee, [place].  Patient Active Problem List   Diagnosis Date Noted  . 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 09/28/2013  . 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 History of seizures as a child 09/28/2013  . Dermatitis 02/06/2012  . Abnormal Pap smear 09/29/2011  . Irregular periods/menstrual cycles 09/29/2011    Chief Complaint  Patient presents with  . Labor Eval   HPI:  See above  OB History   Grav Para Term Preterm Abortions TAB SAB Ect Mult Living   2 1 1  0 0 0 0 0 0 1    2013--SVB, waterbirth, 40 5/7 weeks, 21 hour labor, 41 min 2nd stage, female, 6+6, female, delivered by Maylon CosSuzanne [place] for [employer]  Past Medical History  Diagnosis Date  . H/O varicella   . H/O rubella   . Abnormal Pap smear 2011  . Hepatitis A 2000    after trip to [place]  . History of positive PPD, treatment status unknown     Past Surgical History  Procedure Laterality Date  . Colposcopy  3-13    Family History  Problem Relation Age of Onset  . COPD Paternal Grandfather   . Anemia Mother     History  Substance Use Topics  . Smoking status: Never Smoker   . Smokeless tobacco: Never Used  . Alcohol Use: No    Allergies:  Allergies  Allergen Reactions  . Motrin [Ibuprofen] Rash  . Latex Rash    Prescriptions prior to admission  Medication Sig Dispense Refill  . Prenatal Vit-Fe Fumarate-FA (PRENATAL MULTIVITAMIN) TABS Take 1 tablet by mouth at bedtime.         ROS:  Contractions, +FM, bloody show Physical Exam   Blood pressure 117/74, pulse 70, resp. rate 18, height 5\' 2"  (1.575 m), weight 178 lb (80.74 kg).  Physical  Exam Chest clear Heart RRR without murmur Abd gravid, NT Pelvic--1-2 cm, 70%, vtx, -2, IBOW Ext WNL  FHR Category 1 UCs q 5-8 min, moderate    ED Course  IUP at 40 1/7 weeks ? Early vs prodromal labor GBS negative Planning waterbirth  Plan: Ambulate, then re-evaluate. Sanda KleinShelley Mace Weinberg, CNM, will re-evaluate.   Nigel BridgemanVicki Latham CNM, MSN 09/28/2013 6:24 PM  Addendum at 2000  S: pt back from walking, ctx about the same, has to breathe some through them, has had sm amt bloody show  O: vss, fhr not currently reactive but was cat 1 initially VE: some slight change, although diff examiner, 2.5-3/90/-2 -3 cervix slightly posterior  A: IUP at [redacted]w[redacted]d Early labor Declines interventions, planning WB  P: discussed options including staying for further observation or going home Offered pain meds pt declined Agrees to [place], but may not take it, Rx for 5 tabs 10mg  given Would like to go home  rv'd reasons to return and [facility] Enc to call w questions dc'd home in stable condition

## 2013-09-28 NOTE — MAU Note (Signed)
Pt presents with complaints of contractions that started at 1am. Denies any LOF, states some mucus discharge.

## 2013-09-29 ENCOUNTER — Encounter (HOSPITAL_COMMUNITY): Payer: Self-pay | Admitting: *Deleted

## 2013-09-29 LAB — CBC
HCT: 31.4 % — ABNORMAL LOW (ref 36.0–46.0)
Hemoglobin: 10.5 g/dL — ABNORMAL LOW (ref 12.0–15.0)
MCH: 27.5 pg (ref 26.0–34.0)
MCHC: 33.4 g/dL (ref 30.0–36.0)
MCV: 82.2 fL (ref 78.0–100.0)
Platelets: 227 10*3/uL (ref 150–400)
RBC: 3.82 MIL/uL — AB (ref 3.87–5.11)
RDW: 14.9 % (ref 11.5–15.5)
WBC: 10.9 10*3/uL — ABNORMAL HIGH (ref 4.0–10.5)

## 2013-09-29 LAB — TYPE AND SCREEN
ABO/RH(D): B POS
Antibody Screen: NEGATIVE

## 2013-09-29 LAB — RPR

## 2013-09-29 LAB — ABO/RH: ABO/RH(D): B POS

## 2013-09-29 MED ORDER — BENZOCAINE-MENTHOL 20-0.5 % EX AERO
1.0000 "application " | INHALATION_SPRAY | CUTANEOUS | Status: DC | PRN
  Filled 2013-09-29: qty 56

## 2013-09-29 MED ORDER — ACETAMINOPHEN 325 MG PO TABS
650.0000 mg | ORAL_TABLET | ORAL | Status: DC | PRN
  Administered 2013-09-29 – 2013-09-30 (×8): 650 mg via ORAL
  Filled 2013-09-29 (×8): qty 2

## 2013-09-29 MED ORDER — ONDANSETRON HCL 4 MG/2ML IJ SOLN: 4.0000 mg | INTRAMUSCULAR | Status: DC | PRN

## 2013-09-29 MED ORDER — BISACODYL 10 MG RE SUPP: 10.0000 mg | Freq: Every day | RECTAL | Status: DC | PRN

## 2013-09-29 MED ORDER — MEASLES, MUMPS & RUBELLA VAC ~~LOC~~ INJ
0.5000 mL | INJECTION | Freq: Once | SUBCUTANEOUS | Status: DC
  Filled 2013-09-29: qty 0.5

## 2013-09-29 MED ORDER — WITCH HAZEL-GLYCERIN EX PADS: 1.0000 "application " | MEDICATED_PAD | CUTANEOUS | Status: DC | PRN

## 2013-09-29 MED ORDER — ONDANSETRON HCL 4 MG PO TABS: 4.0000 mg | ORAL_TABLET | ORAL | Status: DC | PRN

## 2013-09-29 MED ORDER — PRENATAL MULTIVITAMIN CH
1.0000 | ORAL_TABLET | Freq: Every day | ORAL | Status: DC
  Administered 2013-09-29 – 2013-09-30 (×2): 1 via ORAL
  Filled 2013-09-29: qty 1

## 2013-09-29 MED ORDER — TETANUS-DIPHTH-ACELL PERTUSSIS 5-2.5-18.5 LF-MCG/0.5 IM SUSP: 0.5000 mL | Freq: Once | INTRAMUSCULAR | Status: DC

## 2013-09-29 MED ORDER — FLEET ENEMA 7-19 GM/118ML RE ENEM: 1.0000 | ENEMA | Freq: Every day | RECTAL | Status: DC | PRN

## 2013-09-29 MED ORDER — MEDROXYPROGESTERONE ACETATE 150 MG/ML IM SUSP: 150.0000 mg | INTRAMUSCULAR | Status: DC | PRN

## 2013-09-29 MED ORDER — DIPHENHYDRAMINE HCL 25 MG PO CAPS: 25.0000 mg | ORAL_CAPSULE | Freq: Four times a day (QID) | ORAL | Status: DC | PRN

## 2013-09-29 MED ORDER — OXYCODONE-ACETAMINOPHEN 5-325 MG PO TABS
1.0000 | ORAL_TABLET | ORAL | Status: DC | PRN
  Filled 2013-09-29: qty 1

## 2013-09-29 MED ORDER — ZOLPIDEM TARTRATE 5 MG PO TABS: 5.0000 mg | ORAL_TABLET | Freq: Every evening | ORAL | Status: DC | PRN

## 2013-09-29 MED ORDER — DIBUCAINE 1 % RE OINT: 1.0000 "application " | TOPICAL_OINTMENT | RECTAL | Status: DC | PRN

## 2013-09-29 MED ORDER — SIMETHICONE 80 MG PO CHEW: 80.0000 mg | CHEWABLE_TABLET | ORAL | Status: DC | PRN

## 2013-09-29 MED ORDER — LANOLIN HYDROUS EX OINT: TOPICAL_OINTMENT | CUTANEOUS | Status: DC | PRN

## 2013-09-29 MED ORDER — SENNOSIDES-DOCUSATE SODIUM 8.6-50 MG PO TABS: 2.0000 | ORAL_TABLET | ORAL | Status: DC

## 2013-09-29 NOTE — Progress Notes (Signed)
Subjective: Postpartum Day 1: Vaginal delivery, no laceration Patient up ad lib, reports no syncope or dizziness. Feeding:  Breast Contraceptive plan:  Undecided Pleased with birth process, even though her labor advanced too fast for use of waterbirth tub.  Objective: Vital signs in last 24 hours: Temp:  [97.6 F (36.4 C)-98.4 F (36.9 C)] 97.6 F (36.4 C) (04/12 0626) Pulse Rate:  [64-123] 82 (04/12 0626) Resp:  [18-20] 18 (04/12 0626) BP: (88-139)/(49-122) 98/67 mmHg (04/12 0626) Weight:  [178 lb (80.74 kg)] 178 lb (80.74 kg) (04/11 1816)  Physical Exam:  General: alert Lochia: appropriate Uterine Fundus: firm Perineum: Intact perineum DVT Evaluation: No evidence of DVT seen on physical exam. Negative Homan's sign.    Recent Labs  09/28/13 2250 09/29/13 0725  HGB 11.2* 10.5*  HCT 33.5* 31.4*    Assessment/Plan: Status post vaginal delivery day 1. Stable Continue current care. Plan for discharge tomorrow    Nigel BridgemanVicki Avry Monte[place]one 09/29/2013, 9:21 AM

## 2013-09-29 NOTE — Lactation Note (Signed)
This note was copied from the chart of Boy Debroah Looprika Sansone. Lactation Consultation Note  Patient Name: Boy Debroah Looprika Evrard EAVWU'JToday's Date: 09/29/2013 Reason for consult: Initial assessment of this mom and baby at 24 hours postpartum.  Initial latch difficulty led RN during the night to assist using a #20 NS but mom states that baby is latching better now and she is not using NS. Mom is experienced with breastfeeding first baby for one year.  Mom has baby in sidelying position facing breast and reports that baby fed recently but is sleepy now.  LC encouraged STS and cue feedings. LC encouraged review of Baby and Me pp 9, 14 and 20-25 for STS and BF information. LC provided [employer] Resource brochure and reviewed [facility] and list of community and web site resources.  Mom had used a hand pump with her first child and asked for one to use as needed for this baby.  She states she is familiar with how to use and clean the Medela harmony pump.      Maternal Data Formula Feeding for Exclusion: No Infant to breast within first hour of birth: Yes (LATCH score=7 but baby sleepy at breast) Has patient been taught Hand Expression?: Yes Does the patient have breastfeeding experience prior to this delivery?: Yes  Feeding Feeding Type: Breast Fed Length of feed: 30 min  LATCH Score/Interventions            Most recent LATCH score was "7" per RN last night, with use of NS          Lactation Tools Discussed/Used     Consult Status Consult Status: Follow-up Date: 09/30/13 Follow-up type: In-patient    Zara ChessJoanne P Youssouf Shipley 09/29/2013, 10:09 PM

## 2013-09-30 MED ORDER — ACETAMINOPHEN 325 MG PO TABS: 650.0000 mg | ORAL_TABLET | ORAL | Status: DC | PRN

## 2013-09-30 NOTE — Lactation Note (Addendum)
This note was copied from the chart of Isabella Debroah Looprika Witz. Lactation Consultation Note  Patient Name: Isabella Watkins ZOXWR'UToday's Date: 09/30/2013 Reason for consult: Follow-up assessment Per mom the baby has been cluster feeding. LC checked doc flow sheets , and noted  The baby has been consistent with feedings and cluster feeding. Reviewed basics with mom  Sore nipple and engorgement prevention and tx . Referring to the Baby and me booklet pg 25. MBU RN plans to show mom how to use hand pump. LC assessed latch , LC noted small  Indentation noted midline of tongue, baby was crying so LC unable to assess if the baby could  Stretch tongue over gum line. LC also assessed for high palate, palate didn't seem over elevated.  Baby awake and rooting. Mom independent with positioning and latching . LC assisted with flipping  upper lip open to cover more of the areola . Noted several swallows and a consistent pattern, increase  with breast compressions. Per mom comfortable. No color changes noted when baby was feeding.    Maternal Data    Feeding Feeding Type: Breast Fed Length of feed: 8 min  LATCH Score/Interventions Latch: Grasps breast easily, tongue down, lips flanged, rhythmical sucking.  Audible Swallowing: Spontaneous and intermittent Intervention(s): Skin to skin  Type of Nipple: Everted at rest and after stimulation  Comfort (Breast/Nipple): Soft / non-tender (per mom feeling fuller )     Hold (Positioning): Assistance needed to correctly position infant at breast and maintain latch. (worked on depth ) Intervention(s): Breastfeeding basics reviewed;Support Pillows;Position options;Skin to skin  LATCH Score: 9  Lactation Tools Discussed/Used Tools: Pump (MBU RN plans to instruct patient ) Nipple shield size:  (per mom not using the nipple shield ) Breast pump type: Manual   Consult Status Consult Status: Complete    Isabella Watkins 09/30/2013, 9:49 AM

## 2013-09-30 NOTE — Discharge Summary (Signed)
Vaginal Delivery Discharge Summary  Isabella Watkins  DOB:    04/18/1986 MRN:    [id number] CSN:    [id number]  Date of admission:                  09/28/2013   Date of discharge:                   09/30/2013  Procedures this admission:  Precipitous SVD of baby boy.  Periurethral laceration without repair  Date of Delivery: 09/28/2013  Newborn Data:  Live born female  Birth Weight: 6 lb 13.2 oz (3096 g) APGAR: 8, 9  Home with mother. Name: Isabella Watkins Circumcision Plan: Inpatient  History of Present Illness:  [name] is a 28 y.o. female, [id number], who presents at 3662w1d weeks gestation. The patient has been [facility] at [facility]. She was admitted onset of labor. Her pregnancy has been complicated by: none.  Hospital course:  The patient was admitted for active labor.   Her labor was not complicated, but delivery was percipitous.  Her delivery was attended by Almond LintShelly Lillard, CNM. She proceeded to have a vaginal delivery of a healthy infant boy. Her delivery was not complicated. Her postpartum course was not complicated.  She was discharged to home on postpartum day 2 doing well.  Feeding:  breast  Contraception:  vasectomy  Discharge hemoglobin:  Hemoglobin  Date Value Ref Range Status  09/29/2013 10.5* 12.0 - 15.0 g/dL Final     HCT  Date Value Ref Range Status  09/29/2013 31.4* 36.0 - 46.0 % Final    Discharge Physical Exam:   General: alert, cooperative and no distress Lochia: appropriate Uterine Fundus: firm Incision: N/A DVT Evaluation: No evidence of DVT seen on physical exam. Negative Homan's sign. No significant calf/ankle edema.  Intrapartum Procedures: spontaneous vaginal delivery Postpartum Procedures: none Complications-Operative and Postpartum: periurethral laceration  Discharge Diagnoses: Term Pregnancy-delivered  Discharge Information:  Activity:           pelvic  rest Diet:                routine Medications: tylenol Condition:      stable Instructions:   Postpartum Care After Vaginal Delivery  After you deliver your newborn (postpartum period), the usual stay in the hospital is 24 72 hours. If there were problems with your labor or delivery, or if you have other medical problems, you might be in the hospital longer.  While you are in the hospital, you will receive help and instructions on how to care for yourself and your newborn during the postpartum period.  While you are in the hospital:  Be sure to tell your nurses if you have pain or discomfort, as well as where you feel the pain and what makes the pain worse.  If you had an incision made near your vagina (episiotomy) or if you had some tearing during delivery, the nurses may put ice packs on your episiotomy or tear. The ice packs may help to reduce the pain and swelling.  If you are breastfeeding, you may feel uncomfortable contractions of your uterus for a couple of weeks. This is normal. The contractions help your uterus get back to normal size.  It is normal to have some bleeding after delivery.  For the first 1 3 days after delivery, the flow is red and the amount may be similar to a period.  It is  common for the flow to start and stop.  In the first few days, you may pass some small clots. Let your nurses know if you begin to pass large clots or your flow increases.  Do not  flush blood clots down the toilet before having the nurse look at them.  During the next 3 10 days after delivery, your flow should become more watery and pink or brown-tinged in color.  Ten to fourteen days after delivery, your flow should be a small amount of yellowish-white discharge.  The amount of your flow will decrease over the first few weeks after delivery. Your flow may stop in 6 8 weeks. Most women have had their flow stop by 12 weeks after delivery.  You should change your sanitary pads  frequently.  Wash your hands thoroughly with soap and water for at least 20 seconds after changing pads, using the toilet, or before holding or feeding your newborn.  You should feel like you need to empty your bladder within the first 6 8 hours after delivery.  In case you become weak, lightheaded, or faint, call your nurse before you get out of bed for the first time and before you take a shower for the first time.  Within the first few days after delivery, your breasts may begin to feel tender and full. This is called engorgement. Breast tenderness usually goes away within 48 72 hours after engorgement occurs. You may also notice milk leaking from your breasts. If you are not breastfeeding, do not stimulate your breasts. Breast stimulation can make your breasts produce more milk.  Spending as much time as possible with your newborn is very important. During this time, you and your newborn can feel close and get to know each other. Having your newborn stay in your room (rooming in) will help to strengthen the bond with your newborn. It will give you time to get to know your newborn and become comfortable caring for your newborn.  Your hormones change after delivery. Sometimes the hormone changes can temporarily cause you to feel sad or tearful. These feelings should not last more than a few days. If these feelings last longer than that, you should talk to your caregiver.  If desired, talk to your caregiver about methods of family planning or contraception.  Talk to your caregiver about immunizations. Your caregiver may want you to have the following immunizations before leaving the hospital:  Tetanus, diphtheria, and pertussis (Tdap) or tetanus and diphtheria (Td) immunization. It is very important that you and your family (including grandparents) or others caring for your newborn are up-to-date with the Tdap or Td immunizations. The Tdap or Td immunization can help protect your newborn from  getting ill.  Rubella immunization.  Varicella (chickenpox) immunization.  Influenza immunization. You should receive this annual immunization if you did not receive the immunization during your pregnancy. Document Released: 04/03/2007 Document Revised: 02/29/2012 Document Reviewed: 02/01/2012 [facility] Patient Information 2014 [place], [place].   Postpartum Depression and Baby Blues  The postpartum period begins right after the birth of a baby. During this time, there is often a great amount of joy and excitement. It is also a time of considerable changes in the life of the parent(s). Regardless of how many times a mother gives birth, each child brings new challenges and dynamics to the family. It is not unusual to have feelings of excitement accompanied by confusing shifts in moods, emotions, and thoughts. All mothers are at risk of developing postpartum depression or  the "baby blues." These mood changes can occur right after giving birth, or they may occur many months after giving birth. The baby blues or postpartum depression can be mild or severe. Additionally, postpartum depression can resolve rather quickly, or it can be a long-term condition. CAUSES Elevated hormones and their rapid decline are thought to be a main cause of postpartum depression and the baby blues. There are a number of hormones that radically change during and after pregnancy. Estrogen and progesterone usually decrease immediately after delivering your baby. The level of thyroid hormone and various cortisol steroids also rapidly drop. Other factors that play a major role in these changes include major life events and genetics.  RISK FACTORS If you have any of the following risks for the baby blues or postpartum depression, know what symptoms to watch out for during the postpartum period. Risk factors that may increase the likelihood of getting the baby blues or postpartum depression include:  Havinga personal or family  history of depression.  Having depression while being pregnant.  Having premenstrual or oral contraceptive-associated mood issues.  Having exceptional life stress.  Having marital conflict.  Lacking a social support network.  Having a baby with special needs.  Having health problems such as diabetes. SYMPTOMS Baby blues symptoms include:  Brief fluctuations in mood, such as going from extreme happiness to sadness.  Decreased concentration.  Difficulty sleeping.  Crying spells, tearfulness.  Irritability.  Anxiety. Postpartum depression symptoms typically begin within the first month after giving birth. These symptoms include:  Difficulty sleeping or excessive sleepiness.  Marked weight loss.  Agitation.  Feelings of worthlessness.  Lack of interest in activity or food. Postpartum psychosis is a very concerning condition and can be dangerous. Fortunately, it is rare. Displaying any of the following symptoms is cause for immediate medical attention. Postpartum psychosis symptoms include:  Hallucinations and delusions.  Bizarre or disorganized behavior.  Confusion or disorientation. DIAGNOSIS  A diagnosis is made by an evaluation of your symptoms. There are no medical or lab tests that lead to a diagnosis, but there are various questionnaires that a caregiver may use to identify those with the baby blues, postpartum depression, or psychosis. Often times, a screening tool called the [place] Postnatal Depression Scale is used to diagnose depression in the postpartum period.  TREATMENT The baby blues usually goes away on its own in 1 to 2 weeks. Social support is often all that is needed. You should be encouraged to get adequate sleep and rest. Occasionally, you may be given medicines to help you sleep.  Postpartum depression requires treatment as it can last several months or longer if it is not treated. Treatment may include individual or group therapy, medicine, or  both to address any social, physiological, and psychological factors that may play a role in the depression. Regular exercise, a healthy diet, rest, and social support may also be strongly recommended.  Postpartum psychosis is more serious and needs treatment right away. Hospitalization is often needed. HOME CARE INSTRUCTIONS  Get as much rest as you can. Nap when the baby sleeps.  Exercise regularly. Some women find yoga and walking to be beneficial.  Eat a balanced and nourishing diet.  Do little things that you enjoy. Have a cup of tea, take a bubble bath, read your favorite magazine, or listen to your favorite music.  Avoid alcohol.  Ask for help with household chores, cooking, grocery shopping, or running errands as needed. Do not try to do everything.  Talk  to people close to you about how you are feeling. Get support from your partner, family members, friends, or other new moms.  Try to stay positive in how you think. Think about the things you are grateful for.  Do not spend a lot of time alone.  Only take medicine as directed by your caregiver.  Keep all your postpartum appointments.  Let your caregiver know if you have any concerns. SEEK MEDICAL CARE IF: You are having a reaction or problems with your medicine. SEEK IMMEDIATE MEDICAL CARE IF:  You have suicidal feelings.  You feel you may harm the baby or someone else. Document Released: 03/10/2004 Document Revised: 08/29/2011 Document Reviewed: 04/12/2011 [facility] Patient Information 2014 [place], [place].   Discharge to: home  Follow-up Information   Follow up with [facility]. Schedule an appointment as soon as possible for a visit in 5 weeks. (Please call if you have any questions or concerns prior to your visit. )    Specialty:  Obstetrics and Gynecology   Contact information:   [address] [place] [address] [phone]       Joyice Faster  [facility] 09/30/2013

## 2013-09-30 NOTE — Discharge Instructions (Signed)
Postpartum Depression and Baby Blues °The postpartum period begins right after the birth of a baby. During this time, there is often a great amount of joy and excitement. It is also a time of considerable changes in the life of the parent(s). Regardless of how many times a mother gives birth, each child brings new challenges and dynamics to the family. It is not unusual to have feelings of excitement accompanied by confusing shifts in moods, emotions, and thoughts. All mothers are at risk of developing postpartum depression or the "baby blues." These mood changes can occur right after giving birth, or they may occur many months after giving birth. The baby blues or postpartum depression can be mild or severe. Additionally, postpartum depression can resolve rather quickly, or it can be a long-term condition. °CAUSES °Elevated hormones and their rapid decline are thought to be a main cause of postpartum depression and the baby blues. There are a number of hormones that radically change during and after pregnancy. Estrogen and progesterone usually decrease immediately after delivering your baby. The level of thyroid hormone and various cortisol steroids also rapidly drop. Other factors that play a major role in these changes include major life events and genetics.  °RISK FACTORS °If you have any of the following risks for the baby blues or postpartum depression, know what symptoms to watch out for during the postpartum period. Risk factors that may increase the likelihood of getting the baby blues or postpartum depression include: °· Having a personal or family history of depression. °· Having depression while being pregnant. °· Having premenstrual or oral contraceptive-associated mood issues. °· Having exceptional life stress. °· Having marital conflict. °· Lacking a social support network. °· Having a baby with special needs. °· Having health problems such as diabetes. °SYMPTOMS °Baby blues symptoms include: °· Brief  fluctuations in mood, such as going from extreme happiness to sadness. °· Decreased concentration. °· Difficulty sleeping. °· Crying spells, tearfulness. °· Irritability. °· Anxiety. °Postpartum depression symptoms typically begin within the first month after giving birth. These symptoms include: °· Difficulty sleeping or excessive sleepiness. °· Marked weight loss. °· Agitation. °· Feelings of worthlessness. °· Lack of interest in activity or food. °Postpartum psychosis is a very concerning condition and can be dangerous. Fortunately, it is rare. Displaying any of the following symptoms is cause for immediate medical attention. Postpartum psychosis symptoms include: °· Hallucinations and delusions. °· Bizarre or disorganized behavior. °· Confusion or disorientation. °DIAGNOSIS  °A diagnosis is made by an evaluation of your symptoms. There are no medical or lab tests that lead to a diagnosis, but there are various questionnaires that a caregiver may use to identify those with the baby blues, postpartum depression, or psychosis. Often times, a screening tool called the Edinburgh Postnatal Depression Scale is used to diagnose depression in the postpartum period.  °TREATMENT °The baby blues usually goes away on its own in 1 to 2 weeks. Social support is often all that is needed. You should be encouraged to get adequate sleep and rest. Occasionally, you may be given medicines to help you sleep.  °Postpartum depression requires treatment as it can last several months or longer if it is not treated. Treatment may include individual or group therapy, medicine, or both to address any social, physiological, and psychological factors that may play a role in the depression. Regular exercise, a healthy diet, rest, and social support may also be strongly recommended.  °Postpartum psychosis is more serious and needs treatment right away. Hospitalization is   often needed. °HOME CARE INSTRUCTIONS °· Get as much rest as you can. Nap  when the baby sleeps. °· Exercise regularly. Some women find yoga and walking to be beneficial. °· Eat a balanced and nourishing diet. °· Do little things that you enjoy. Have a cup of tea, take a bubble bath, read your favorite magazine, or listen to your favorite music. °· Avoid alcohol. °· Ask for help with household chores, cooking, grocery shopping, or running errands as needed. Do not try to do everything. °· Talk to people close to you about how you are feeling. Get support from your partner, family members, friends, or other new moms. °· Try to stay positive in how you think. Think about the things you are grateful for. °· Do not spend a lot of time alone. °· Only take medicine as directed by your caregiver. °· Keep all your postpartum appointments. °· Let your caregiver know if you have any concerns. °SEEK MEDICAL CARE IF: °You are having a reaction or problems with your medicine. °SEEK IMMEDIATE MEDICAL CARE IF: °· You have suicidal feelings. °· You feel you may harm the baby or someone else. °Document Released: 03/10/2004 Document Revised: 08/29/2011 Document Reviewed: 03/18/2013 °ExitCare® Patient Information ©2014 [employer]. °Postpartum Care After Vaginal Delivery °After you deliver your newborn (postpartum period), the usual stay in the hospital is 24 72 hours. If there were problems with your labor or delivery, or if you have other medical problems, you might be in the hospital longer.  °While you are in the hospital, you will receive help and instructions on how to care for yourself and your newborn during the postpartum period.  °While you are in the hospital: °· Be sure to tell your nurses if you have pain or discomfort, as well as where you feel the pain and what makes the pain worse. °· If you had an incision made near your vagina (episiotomy) or if you had some tearing during delivery, the nurses may put ice packs on your episiotomy or tear. The ice packs may help to reduce the pain and  swelling. °· If you are breastfeeding, you may feel uncomfortable contractions of your uterus for a couple of weeks. This is normal. The contractions help your uterus get back to normal size. °· It is normal to have some bleeding after delivery. °· For the first 1 3 days after delivery, the flow is red and the amount may be similar to a period. °· It is common for the flow to start and stop. °· In the first few days, you may pass some small clots. Let your nurses know if you begin to pass large clots or your flow increases. °· Do not  flush blood clots down the toilet before having the nurse look at them. °· During the next 3 10 days after delivery, your flow should become more watery and pink or brown-tinged in color. °· Ten to fourteen days after delivery, your flow should be a small amount of yellowish-white discharge. °· The amount of your flow will decrease over the first few weeks after delivery. Your flow may stop in 6 8 weeks. Most women have had their flow stop by 12 weeks after delivery. °· You should change your sanitary pads frequently. °· Wash your hands thoroughly with soap and water for at least 20 seconds after changing pads, using the toilet, or before holding or feeding your newborn. °· You should feel like you need to empty your bladder within the first   6 8 hours after delivery. °· In case you become weak, lightheaded, or faint, call your nurse before you get out of bed for the first time and before you take a shower for the first time. °· Within the first few days after delivery, your breasts may begin to feel tender and full. This is called engorgement. Breast tenderness usually goes away within 48 72 hours after engorgement occurs. You may also notice milk leaking from your breasts. If you are not breastfeeding, do not stimulate your breasts. Breast stimulation can make your breasts produce more milk. °· Spending as much time as possible with your newborn is very important. During this time,  you and your newborn can feel close and get to know each other. Having your newborn stay in your room (rooming in) will help to strengthen the bond with your newborn.  It will give you time to get to know your newborn and become comfortable caring for your newborn. °· Your hormones change after delivery. Sometimes the hormone changes can temporarily cause you to feel sad or tearful. These feelings should not last more than a few days. If these feelings last longer than that, you should talk to your caregiver. °· If desired, talk to your caregiver about methods of family planning or contraception. °· Talk to your caregiver about immunizations. Your caregiver may want you to have the following immunizations before leaving the hospital: °· Tetanus, diphtheria, and pertussis (Tdap) or tetanus and diphtheria (Td) immunization. It is very important that you and your family (including grandparents) or others caring for your newborn are up-to-date with the Tdap or Td immunizations. The Tdap or Td immunization can help protect your newborn from getting ill. °· Rubella immunization. °· Varicella (chickenpox) immunization. °· Influenza immunization. You should receive this annual immunization if you did not receive the immunization during your pregnancy. °Document Released: 04/03/2007 Document Revised: 02/29/2012 Document Reviewed: 02/01/2012 °ExitCare® Patient Information ©2014 [employer]. ° °

## 2014-04-21 ENCOUNTER — Encounter (HOSPITAL_COMMUNITY): Payer: Self-pay | Admitting: *Deleted

## 2014-06-20 NOTE — L&D Delivery Note (Addendum)
Delivery Note At 3:40 PM a viable female, "Isabella Watkins", was delivered via Vaginal, Spontaneous Delivery (Presentation: Right Occiput Anterior) in hands and knees position.  APGAR: 9, 9; weight  .   Placenta status: Intact, Spontaneous.  Cord: 3 vessels with the following complications: Occult prolapse.  Cord pH: NA  After transfer to L&D around 1521, patient noted to be complete at 1530, with AROM of clear fluid, then delivery in 10 minutes.  Anesthesia: None  Episiotomy: None Lacerations:  None Suture Repair: None Est. Blood Loss (mL):  100  Mom to postpartum.  Baby to Couplet care / Skin to Skin. Vasectomy for contraception--already done. Patient and husband desire to take placenta home--placenta marked.  Isabella Watkins, Isabella Watkins 12/24/2014, 4:02 PM

## 2014-07-03 ENCOUNTER — Encounter (HOSPITAL_COMMUNITY): Payer: Self-pay | Admitting: *Deleted

## 2014-07-03 ENCOUNTER — Inpatient Hospital Stay (HOSPITAL_COMMUNITY)
Admission: AD | Admit: 2014-07-03 | Discharge: 2014-07-03 | Disposition: A | Discharge status: Home / Self Care | Payer: BLUE CROSS/BLUE SHIELD | Source: Ambulatory Visit | Attending: Obstetrics & Gynecology | Admitting: Obstetrics & Gynecology

## 2014-07-03 DIAGNOSIS — O9989 Other specified diseases and conditions complicating pregnancy, childbirth and the puerperium: Secondary | ICD-10-CM | POA: Insufficient documentation

## 2014-07-03 DIAGNOSIS — Z3A16 16 weeks gestation of pregnancy: Secondary | ICD-10-CM | POA: Diagnosis not present

## 2014-07-03 DIAGNOSIS — R32 Unspecified urinary incontinence: Secondary | ICD-10-CM | POA: Diagnosis not present

## 2014-07-03 DIAGNOSIS — Z91040 Latex allergy status: Secondary | ICD-10-CM | POA: Insufficient documentation

## 2014-07-03 DIAGNOSIS — N898 Other specified noninflammatory disorders of vagina: Secondary | ICD-10-CM | POA: Insufficient documentation

## 2014-07-03 DIAGNOSIS — N949 Unspecified condition associated with female genital organs and menstrual cycle: Secondary | ICD-10-CM | POA: Diagnosis not present

## 2014-07-03 LAB — URINALYSIS, ROUTINE W REFLEX MICROSCOPIC
Bilirubin Urine: NEGATIVE
GLUCOSE, UA: NEGATIVE mg/dL
KETONES UR: NEGATIVE mg/dL
LEUKOCYTES UA: NEGATIVE
Nitrite: NEGATIVE
PH: 6 (ref 5.0–8.0)
PROTEIN: 100 mg/dL — AB
Specific Gravity, Urine: >1.03 — ABNORMAL HIGH (ref 1.005–1.030)
UROBILINOGEN UA: 1 mg/dL (ref 0.0–1.0)

## 2014-07-03 LAB — URINE MICROSCOPIC-ADD ON

## 2014-07-03 LAB — WET PREP, GENITAL
CLUE CELLS WET PREP: NONE SEEN
Trich, Wet Prep: NONE SEEN
Yeast Wet Prep HPF POC: NONE SEEN

## 2014-07-03 NOTE — MAU Note (Signed)
Woke up to a wet bed yesterday afternoon, hasn't noticed  Any since.  Cramping during the night.

## 2014-07-03 NOTE — MAU Provider Note (Signed)
History     CSN: [id number]  Arrival date and time: 07/03/14 1103   First Provider Initiated Contact with Patient 07/03/14 1220      Chief Complaint  Patient presents with  . Abdominal Cramping  . Vaginal Discharge   HPI Isabella Watkins 29 y.o. [id number]  presents to MAU complaining of leaking of fluid.  She reports yesterday afternoon she woke up and noted she was very wet.  She went to the bathroom and urinated normal amt.  Later that night she had cramps in the lower abdomen beginning at 8 or 9pm.  The cramping continued until 3am.  It was 4-5/10, sharp and intermittent.   She is complaining of headache and denies nausea, vomiting, weakness, dysuria, SOB, chest pain, vaginal bleeding.  She has felt a couple small fetal movements previously but none in the last day or two. OB History    Gravida Para Term Preterm AB TAB SAB Ectopic Multiple Living   0 0 0 0 0 0 2      Past Medical History  Diagnosis Date  . H/O varicella   . H/O rubella   . Abnormal Pap smear 2011  . Hepatitis A 2000    after trip to [place]  . History of positive PPD, treatment status unknown     Past Surgical History  Procedure Laterality Date  . Colposcopy  3-13    Family History  Problem Relation Age of Onset  . COPD Paternal Grandfather   . Anemia Mother     History  Substance Use Topics  . Smoking status: Never Smoker   . Smokeless tobacco: Never Used  . Alcohol Use: No    Allergies:  Allergies  Allergen Reactions  . Motrin [Ibuprofen] Rash  . Latex Rash    Prescriptions prior to admission  Medication Sig Dispense Refill Last Dose  . loratadine (CLARITIN) 10 MG tablet Take 10 mg by mouth daily as needed for allergies.   Past Week at Unknown time  . Prenatal Vit-Fe Fumarate-FA (PRENATAL MULTIVITAMIN) TABS Take 1 tablet by mouth daily at 12 noon.    07/03/2014 at Unknown time  . acetaminophen (TYLENOL) 325 MG tablet Take 2 tablets (650 mg total) by mouth every 4 (four) hours  as needed for mild pain or moderate pain. (Patient not taking: Reported on 07/03/2014) 30 tablet 3   . zolpidem (AMBIEN) 10 MG tablet Take 1 tablet (10 mg total) by mouth at bedtime as needed for sleep. (Patient not taking: Reported on 07/03/2014) 5 tablet 0     ROS Pertinent ROS in HPI  Physical Exam   Blood pressure 105/64, pulse 87, temperature 98 F (36.7 C), temperature source Oral, resp. rate 18, height  (1.575 m), weight 168 lb (76.204 kg), unknown if currently breastfeeding.  Physical Exam  Constitutional: She is oriented to person, place, and time. She appears well-developed and well-nourished.  HENT:  Head: Normocephalic and atraumatic.  Eyes: EOM are normal.  Neck: Normal range of motion.  Cardiovascular: Normal rate and regular rhythm.   Respiratory: Effort normal and breath sounds normal.  GI: Soft. Bowel sounds are normal. She exhibits no distension. There is no tenderness. There is no rebound and no guarding.  Genitourinary:  Small amt of thin clear/white discharge pooled in vagina.  Ferning test was negative Cervix is closed No CMT/andexal tenderness  Musculoskeletal: Normal range of motion.  Neurological: She is alert and oriented to person, place, and time.  Skin: Skin is  warm and dry.  Psychiatric: She has a normal mood and affect.   Care turned over to [facility]ica - CNM with [facility] for further eval.   MAU Course  Procedures  MDM   Assessment and Plan    Bertram Denvereague Clark, Karen E 07/03/2014, 12:22 PM

## 2014-07-03 NOTE — Discharge Instructions (Signed)
Urinary Incontinence Urinary incontinence is the involuntary loss of urine from your bladder. CAUSES  There are many causes of urinary incontinence. They include:  Medicines.  Infections.  Prostatic enlargement, leading to overflow of urine from your bladder.  Surgery.  Neurological diseases.  Emotional factors. SIGNS AND SYMPTOMS Urinary Incontinence can be divided into four types: 1. Urge incontinence. Urge incontinence is the involuntary loss of urine before you have the opportunity to go to the bathroom. There is a sudden urge to void but not enough time to reach a bathroom. 2. Stress incontinence. Stress incontinence is the sudden loss of urine with any activity that forces urine to pass. It is commonly caused by anatomical changes to the pelvis and sphincter areas of your body. 3. Overflow incontinence. Overflow incontinence is the loss of urine from an obstructed opening to your bladder. This results in a backup of urine and a resultant buildup of pressure within the bladder. When the pressure within the bladder exceeds the closing pressure of the sphincter, the urine overflows, which causes incontinence, similar to water overflowing a dam. 4. Total incontinence. Total incontinence is the loss of urine as a result of the inability to store urine within your bladder. DIAGNOSIS  Evaluating the cause of incontinence may require:  A thorough and complete medical and obstetric history.  A complete physical exam.  Laboratory tests such as a urine culture and sensitivities. When additional tests are indicated, they can include:  An ultrasound exam.  Kidney and bladder X-rays.  Cystoscopy. This is an exam of the bladder using a narrow scope.  Urodynamic testing to test the nerve function to the bladder and sphincter areas. TREATMENT  Treatment for urinary incontinence depends on the cause:  For urge incontinence caused by a bacterial infection, antibiotics will be prescribed.  If the urge incontinence is related to medicines you take, your health care provider may have you change the medicine.  For stress incontinence, surgery to re-establish anatomical support to the bladder or sphincter, or both, will often correct the condition.  For overflow incontinence caused by an enlarged prostate, an operation to open the channel through the enlarged prostate will allow the flow of urine out of the bladder. In women with fibroids, a hysterectomy may be recommended.  For total incontinence, surgery on your urinary sphincter may help. An artificial urinary sphincter (an inflatable cuff placed around the urethra) may be required. In women who have developed a hole-like passage between their bladder and vagina (vesicovaginal fistula), surgery to close the fistula often is required. HOME CARE INSTRUCTIONS  Normal daily hygiene and the use of pads or adult diapers that are changed regularly will help prevent odors and skin damage.  Avoid caffeine. It can overstimulate your bladder.  Use the bathroom regularly. Try about every 2-3 hours to go to the bathroom, even if you do not feel the need to do so. Take time to empty your bladder completely. After urinating, wait a minute. Then try to urinate again.  For causes involving nerve dysfunction, keep a log of the medicines you take and a journal of the times you go to the bathroom. SEEK MEDICAL CARE IF:  You experience worsening of pain instead of improvement in pain after your procedure.  Your incontinence becomes worse instead of better. SEE IMMEDIATE MEDICAL CARE IF:  You experience fever or shaking chills.  You are unable to pass your urine.  You have redness spreading into your groin or down into your thighs. MAKE SURE   YOU:   Understand these instructions.   Will watch your condition.  Will get help right away if you are not doing well or get worse. Document Released: 07/14/2004 Document Revised: 03/27/2013 Document  Reviewed: 11/13/2012 ExitCare Patient Information 2015 [employer]. This information is not intended to replace advice given to you by your health care provider. Make sure you discuss any questions you have with your health care provider.  

## 2014-07-03 NOTE — MAU Provider Note (Signed)
History    Isabella Watkins is a 29 y.o. G3P2002 at 16.1wks who presents for leaking of fluid.  Patient initially seen and evaluated by MAU provider.  [facility] provider in room to further assess and discharge.  Patient reports leaking of fluid in am prior to urination.  Patient states that she has a "full bladder" upon waking despite several trips to restroom in night.  Patient denies leaking at current while further reporting she "only has this in the morning."   Patient also reports intermittent cramping that is relieved spontaneously, but only "lasts a few seconds."  Patient admits being seen in ER for this cramping and told that due to close gestation this "uterine pains" will be more frequent.  Patient denies other issues.     Patient Active Problem List   Diagnosis Date Noted  . Vaginal delivery 09/29/2013  . 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 09/28/2013  . 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 History of seizures as a child 09/28/2013  . Dermatitis 02/06/2012  . Abnormal Pap smear 09/29/2011  . Irregular periods/menstrual cycles 09/29/2011    Chief Complaint  Patient presents with  . Abdominal Cramping  . Vaginal Discharge   HPI  OB History    Gravida Para Term Preterm AB TAB SAB Ectopic Multiple Living   3 2 2  0 0 0 0 0 0 2      Past Medical History  Diagnosis Date  . H/O varicella   . H/O rubella   . Abnormal Pap smear 2011  . Hepatitis A 2000    after trip to [place]  . History of positive PPD, treatment status unknown     Past Surgical History  Procedure Laterality Date  . Colposcopy  3-13    Family History  Problem Relation Age of Onset  . COPD Paternal Grandfather   . Anemia Mother     History  Substance Use Topics  . Smoking status: Never Smoker   . Smokeless tobacco: Never Used  . Alcohol Use: No    Allergies:  Allergies  Allergen Reactions  . Motrin [Ibuprofen] Rash  . Latex Rash    Prescriptions  prior to admission  Medication Sig Dispense Refill Last Dose  . loratadine (CLARITIN) 10 MG tablet Take 10 mg by mouth daily as needed for allergies.   Past Week at Unknown time  . Prenatal Vit-Fe Fumarate-FA (PRENATAL MULTIVITAMIN) TABS Take 1 tablet by mouth daily at 12 noon.    07/03/2014 at Unknown time  . acetaminophen (TYLENOL) 325 MG tablet Take 2 tablets (650 mg total) by mouth every 4 (four) hours as needed for mild pain or moderate pain. (Patient not taking: Reported on 07/03/2014) 30 tablet 3   . zolpidem (AMBIEN) 10 MG tablet Take 1 tablet (10 mg total) by mouth at bedtime as needed for sleep. (Patient not taking: Reported on 07/03/2014) 5 tablet 0     ROS  See HPI Above Physical Exam   Blood pressure 105/64, pulse 87, temperature 98 F (36.7 C), temperature source Oral, resp. rate 18, height 5\' 2"  (1.575 m), weight 168 lb (76.204 kg), unknown if currently breastfeeding. Results for orders placed or performed during the hospital encounter of 07/03/14 (from the past 24 hour(s))  Urinalysis, Routine w reflex microscopic     Status: Abnormal   Collection Time: 07/03/14 11:29 AM  Result Value Ref Range   Color, Urine YELLOW YELLOW   APPearance HAZY (A) CLEAR  Specific Gravity, Urine >1.030 (H) 1.005 - 1.030   pH 6.0 5.0 - 8.0   Glucose, UA NEGATIVE NEGATIVE mg/dL   Hgb urine dipstick MODERATE (A) NEGATIVE   Bilirubin Urine NEGATIVE NEGATIVE   Ketones, ur NEGATIVE NEGATIVE mg/dL   Protein, ur 161 (A) NEGATIVE mg/dL   Urobilinogen, UA 1.0 0.0 - 1.0 mg/dL   Nitrite NEGATIVE NEGATIVE   Leukocytes, UA NEGATIVE NEGATIVE  Urine microscopic-add on     Status: Abnormal   Collection Time: 07/03/14 11:29 AM  Result Value Ref Range   Squamous Epithelial / LPF FEW (A) RARE   RBC / HPF 3-6 <3 RBC/hpf   Bacteria, UA RARE RARE   Urine-Other MUCOUS PRESENT   Wet prep, genital     Status: Abnormal   Collection Time: 07/03/14  2:10 PM  Result Value Ref Range   Yeast Wet Prep HPF POC NONE  SEEN NONE SEEN   Trich, Wet Prep NONE SEEN NONE SEEN   Clue Cells Wet Prep HPF POC NONE SEEN NONE SEEN   WBC, Wet Prep HPF POC FEW (A) NONE SEEN   Physical Exam Completed by NP ED Course  Assessment: IUP at 16.1wks Leukorrhea Urinary Incontinence Round Ligament Pain  Plan: -Discussed and educated on vaginal discharge in pregnancy -Discussed and educated about urinary incontinence and relief measures -Discussed and educated on round ligament pain -Discussed proper hydration  -Patient encouraged to continue to observe "leaking" and report any constant abdominal cramping -Bleeding precautions  -Keep appt as scheduled: 07/23/2014 -Encouraged to call if any questions or concerns arise prior to next scheduled office visit.  -Discharged to home in good condition  Tedrick Port LYNN CNM, MSN 07/03/2014 2:52 PM

## 2014-12-24 ENCOUNTER — Inpatient Hospital Stay (HOSPITAL_COMMUNITY)
Admission: AD | Admit: 2014-12-24 | Discharge: 2014-12-25 | DRG: 775 | Disposition: A | Discharge status: Home / Self Care | Payer: BLUE CROSS/BLUE SHIELD | Source: Ambulatory Visit | Attending: Obstetrics and Gynecology | Admitting: Obstetrics and Gynecology

## 2014-12-24 ENCOUNTER — Encounter (HOSPITAL_COMMUNITY): Payer: Self-pay | Admitting: *Deleted

## 2014-12-24 ENCOUNTER — Encounter (HOSPITAL_COMMUNITY): Payer: Self-pay

## 2014-12-24 ENCOUNTER — Inpatient Hospital Stay (HOSPITAL_COMMUNITY)
Admission: AD | Admit: 2014-12-24 | Discharge: 2014-12-24 | Disposition: A | Discharge status: Home / Self Care | Payer: BLUE CROSS/BLUE SHIELD | Source: Ambulatory Visit | Attending: Obstetrics and Gynecology | Admitting: Obstetrics and Gynecology

## 2014-12-24 DIAGNOSIS — Z91040 Latex allergy status: Secondary | ICD-10-CM

## 2014-12-24 DIAGNOSIS — Z3A41 41 weeks gestation of pregnancy: Secondary | ICD-10-CM | POA: Diagnosis present

## 2014-12-24 DIAGNOSIS — O26649 Intrahepatic cholestasis of pregnancy, unspecified trimester: Secondary | ICD-10-CM | POA: Diagnosis not present

## 2014-12-24 DIAGNOSIS — K831 Obstruction of bile duct: Secondary | ICD-10-CM | POA: Diagnosis not present

## 2014-12-24 DIAGNOSIS — O480 Post-term pregnancy: Principal | ICD-10-CM | POA: Diagnosis present

## 2014-12-24 DIAGNOSIS — O26619 Liver and biliary tract disorders in pregnancy, unspecified trimester: Secondary | ICD-10-CM

## 2014-12-24 LAB — CBC
HCT: 39.8 % (ref 36.0–46.0)
HEMOGLOBIN: 13.7 g/dL (ref 12.0–15.0)
MCH: 29.8 pg (ref 26.0–34.0)
MCHC: 34.4 g/dL (ref 30.0–36.0)
MCV: 86.5 fL (ref 78.0–100.0)
Platelets: 266 10*3/uL (ref 150–400)
RBC: 4.6 MIL/uL (ref 3.87–5.11)
RDW: 15.1 % (ref 11.5–15.5)
WBC: 11.5 10*3/uL — ABNORMAL HIGH (ref 4.0–10.5)

## 2014-12-24 LAB — TYPE AND SCREEN
ABO/RH(D): B POS
ANTIBODY SCREEN: NEGATIVE

## 2014-12-24 LAB — OB RESULTS CONSOLE GBS: GBS: NEGATIVE

## 2014-12-24 LAB — OB RESULTS CONSOLE HIV ANTIBODY (ROUTINE TESTING): HIV: NONREACTIVE

## 2014-12-24 LAB — OB RESULTS CONSOLE RPR: RPR: NONREACTIVE

## 2014-12-24 MED ORDER — OXYCODONE-ACETAMINOPHEN 5-325 MG PO TABS: 2.0000 | ORAL_TABLET | ORAL | Status: DC | PRN

## 2014-12-24 MED ORDER — EPHEDRINE 5 MG/ML INJ: 10.0000 mg | INTRAVENOUS | Status: DC | PRN

## 2014-12-24 MED ORDER — OXYCODONE-ACETAMINOPHEN 5-325 MG PO TABS
1.0000 | ORAL_TABLET | ORAL | Status: DC | PRN
Start: 2014-12-24 — End: 2014-12-24

## 2014-12-24 MED ORDER — ONDANSETRON HCL 4 MG PO TABS: 4.0000 mg | ORAL_TABLET | ORAL | Status: DC | PRN

## 2014-12-24 MED ORDER — DIPHENHYDRAMINE HCL 25 MG PO CAPS: 25.0000 mg | ORAL_CAPSULE | Freq: Four times a day (QID) | ORAL | Status: DC | PRN

## 2014-12-24 MED ORDER — PHENYLEPHRINE 40 MCG/ML (10ML) SYRINGE FOR IV PUSH (FOR BLOOD PRESSURE SUPPORT): 80.0000 ug | PREFILLED_SYRINGE | INTRAVENOUS | Status: DC | PRN

## 2014-12-24 MED ORDER — OXYTOCIN 40 UNITS IN LACTATED RINGERS INFUSION - SIMPLE MED
INTRAVENOUS | Status: AC
  Administered 2014-12-24: 999 mL/h via INTRAVENOUS
  Filled 2014-12-24: qty 1000

## 2014-12-24 MED ORDER — SENNOSIDES-DOCUSATE SODIUM 8.6-50 MG PO TABS
2.0000 | ORAL_TABLET | ORAL | Status: DC
  Administered 2014-12-24: 2 via ORAL
  Filled 2014-12-24: qty 2

## 2014-12-24 MED ORDER — FLEET ENEMA 7-19 GM/118ML RE ENEM: 1.0000 | ENEMA | RECTAL | Status: DC | PRN

## 2014-12-24 MED ORDER — WITCH HAZEL-GLYCERIN EX PADS: 1.0000 "application " | MEDICATED_PAD | CUTANEOUS | Status: DC | PRN

## 2014-12-24 MED ORDER — OXYCODONE-ACETAMINOPHEN 5-325 MG PO TABS
2.0000 | ORAL_TABLET | Freq: Once | ORAL | Status: AC
  Administered 2014-12-24: 2 via ORAL
  Filled 2014-12-24: qty 2

## 2014-12-24 MED ORDER — OXYCODONE-ACETAMINOPHEN 5-325 MG PO TABS
2.0000 | ORAL_TABLET | ORAL | Status: DC | PRN
  Administered 2014-12-25 (×2): 2 via ORAL
  Filled 2014-12-24 (×2): qty 2

## 2014-12-24 MED ORDER — ZOLPIDEM TARTRATE 5 MG PO TABS: 5.0000 mg | ORAL_TABLET | Freq: Every evening | ORAL | Status: DC | PRN

## 2014-12-24 MED ORDER — ACETAMINOPHEN 325 MG PO TABS
650.0000 mg | ORAL_TABLET | ORAL | Status: DC | PRN
Start: 2014-12-24 — End: 2014-12-25
  Administered 2014-12-24: 650 mg via ORAL
  Filled 2014-12-24: qty 2

## 2014-12-24 MED ORDER — ACETAMINOPHEN 325 MG PO TABS: 650.0000 mg | ORAL_TABLET | ORAL | Status: DC | PRN

## 2014-12-24 MED ORDER — DIPHENHYDRAMINE HCL 50 MG/ML IJ SOLN: 12.5000 mg | INTRAMUSCULAR | Status: DC | PRN

## 2014-12-24 MED ORDER — FENTANYL CITRATE (PF) 100 MCG/2ML IJ SOLN: 100.0000 ug | INTRAMUSCULAR | Status: DC | PRN

## 2014-12-24 MED ORDER — LIDOCAINE HCL (PF) 1 % IJ SOLN
INTRAMUSCULAR | Status: AC
  Filled 2014-12-24: qty 30

## 2014-12-24 MED ORDER — OXYCODONE-ACETAMINOPHEN 5-325 MG PO TABS
1.0000 | ORAL_TABLET | ORAL | Status: DC | PRN
  Administered 2014-12-24: 1 via ORAL
  Filled 2014-12-24: qty 1

## 2014-12-24 MED ORDER — LACTATED RINGERS IV SOLN: 500.0000 mL | INTRAVENOUS | Status: DC | PRN

## 2014-12-24 MED ORDER — SIMETHICONE 80 MG PO CHEW: 80.0000 mg | CHEWABLE_TABLET | ORAL | Status: DC | PRN

## 2014-12-24 MED ORDER — BENZOCAINE-MENTHOL 20-0.5 % EX AERO
1.0000 "application " | INHALATION_SPRAY | CUTANEOUS | Status: DC | PRN
  Administered 2014-12-25: 1 via TOPICAL
  Filled 2014-12-24 (×2): qty 56

## 2014-12-24 MED ORDER — CITRIC ACID-SODIUM CITRATE 334-500 MG/5ML PO SOLN: 30.0000 mL | ORAL | Status: DC | PRN

## 2014-12-24 MED ORDER — LACTATED RINGERS IV SOLN
INTRAVENOUS | Status: DC
  Administered 2014-12-24: 15:00:00 via INTRAVENOUS

## 2014-12-24 MED ORDER — LANOLIN HYDROUS EX OINT: TOPICAL_OINTMENT | CUTANEOUS | Status: DC | PRN

## 2014-12-24 MED ORDER — OXYTOCIN BOLUS FROM INFUSION: 500.0000 mL | INTRAVENOUS | Status: DC

## 2014-12-24 MED ORDER — DIBUCAINE 1 % RE OINT
1.0000 "application " | TOPICAL_OINTMENT | RECTAL | Status: DC | PRN
  Filled 2014-12-24: qty 28

## 2014-12-24 MED ORDER — OXYTOCIN 40 UNITS IN LACTATED RINGERS INFUSION - SIMPLE MED
62.5000 mL/h | INTRAVENOUS | Status: DC
  Administered 2014-12-24: 999 mL/h via INTRAVENOUS

## 2014-12-24 MED ORDER — ONDANSETRON HCL 4 MG/2ML IJ SOLN: 4.0000 mg | Freq: Four times a day (QID) | INTRAMUSCULAR | Status: DC | PRN

## 2014-12-24 MED ORDER — LIDOCAINE HCL (PF) 1 % IJ SOLN: 30.0000 mL | INTRAMUSCULAR | Status: DC | PRN

## 2014-12-24 MED ORDER — PRENATAL MULTIVITAMIN CH
1.0000 | ORAL_TABLET | Freq: Every day | ORAL | Status: DC
  Administered 2014-12-25: 1 via ORAL
  Filled 2014-12-24: qty 1

## 2014-12-24 MED ORDER — FENTANYL 2.5 MCG/ML BUPIVACAINE 1/10 % EPIDURAL INFUSION (WH - ANES): 14.0000 mL/h | INTRAMUSCULAR | Status: DC | PRN

## 2014-12-24 MED ORDER — TETANUS-DIPHTH-ACELL PERTUSSIS 5-2.5-18.5 LF-MCG/0.5 IM SUSP
0.5000 mL | Freq: Once | INTRAMUSCULAR | Status: DC
  Filled 2014-12-24: qty 0.5

## 2014-12-24 MED ORDER — ONDANSETRON HCL 4 MG/2ML IJ SOLN: 4.0000 mg | INTRAMUSCULAR | Status: DC | PRN

## 2014-12-24 NOTE — H&P (Signed)
Isabella Watkins is a 29 y.o. female, G3P2002 at 27 1/7 weeks, presenting for increased intensity and frequency of UCs since evaluation in MAU in early am.  Denies leaking or bleeding, reports unsure of FM since this am.  Cervix was 1 cm on last evaluation in MAU early am.  Patient Active Problem List   Diagnosis Date Noted  . Pain of round ligament 07/03/2014  . Vaginal delivery 09/29/2013  . 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 09/28/2013  . 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 History of seizures as a child 09/28/2013  . Dermatitis 02/06/2012  . Abnormal Pap smear 09/29/2011  . Irregular periods/menstrual cycles 09/29/2011    History of present pregnancy: Patient entered care at 7-8 weeks.   EDC of 12/17/14 was established by 1st trimester [place].   Anatomy scan:  22 weeks, with normal findings and an posterior placenta.   Additional [place] evaluations:   BPP/AFI from 29-32 weeks due to presumptive dx of cholestasis, all WNL.   Significant prenatal events:  Planning waterbirth x 3.  No consents signed during visits.  Had itching beginning at 29 weeks, with bile acids of 11--patient declined treatment, requested recheck.  Saw chiropracter for "liver draining", and f/u bile acids were WNL.  Was followed from 29-33 weeks with weekly BPPs due to elevated bile acids, but then these were d/c'd when bile acids normalized.   Last evaluation:  Seen in early AM 7/6 for labor check--1 cm, home to await increased labor.  OB History    Gravida Para Term Preterm AB TAB SAB Ectopic Multiple Living   0 0 0 0 0 0 2    2013--SVB, 41 weeks, 24 hour labor, 6+7, female, no meds, waterbirth with [facility] 2015--SVB, 40 1/7 weeks, rapid labor, 6+13, female, no meds, delivered with [facility]  Past Medical History  Diagnosis Date  . H/O varicella   . H/O rubella   . Abnormal Pap smear 2011  . Hepatitis A 2000    after trip to [place]  . History of positive PPD, treatment  status unknown    Past Surgical History  Procedure Laterality Date  . Colposcopy  3-13   Family History: family history includes Anemia in her mother; COPD in her paternal grandfather.   Social History:  reports that she has never smoked. She has never used smokeless tobacco. She reports that she does not drink alcohol or use illicit drugs.  Patient is Hispanic, of the [place] faith, married to [place], who is involved and supportive.  She is a [employer], with 2 years of college.   Prenatal Transfer Tool  Maternal Diabetes: No Genetic Screening: Declined Maternal Ultrasounds/Referrals: Normal Fetal Ultrasounds or other Referrals:  None Maternal Substance Abuse:  No Significant Maternal Medications:  None Significant Maternal Lab Results: Lab values include: Group B Strep negative  TDAP na Flu na  ROS:  Contractions, +FM  Allergies  Allergen Reactions  . Motrin [Ibuprofen] Rash  . Latex Rash     Dilation: 7 Effacement (%): 100 Exam by:: V Ruger Saxer CNM Blood pressure 115/73, pulse 76, temperature 98 F (36.7 C), temperature source Oral, resp. rate 18, height  (1.6 m), weight 84.369 kg (186 lb), unknown if currently breastfeeding.  Chest clear Heart RRR without murmur Abd gravid, NT, FH 39 Pelvic: As above, BBOW. Ext: WNL  FHR: Category 1 UCs:  q 3-5 min, strong  Prenatal labs: ABO, Rh:  B+ Antibody:  Neg  Rubella:   Immune RPR:   NR HBsAg:   Neg HIV:   NR GBS:  11/18/14 Sickle cell/Hgb electrophoresis:  AA Pap:  04/29/14 WNL GC:  Negative 04/29/14 Chlamydia:  Negative 04/29/14 Genetic screenings:  Declined Glucola:  WNL Other:   Hgb 12.2 at NOB, 11.5 at 28 weeks   Assessment/Plan: IUP at 41 weeks Active labor GBS negative Desires waterbirth  [facility] with Dr. Normand Sloopillard. Routine [facility] orders Patient and husband aware rapid labor progress will preclude use of waterbirth tub.  Marteze Vecchio, VICKICNM, [place] 12/24/2014, 2:57  PM

## 2014-12-24 NOTE — Discharge Instructions (Signed)

## 2014-12-24 NOTE — MAU Provider Note (Signed)
Isabella Watkins is a 29 y.o. G3P2 at 41.0 weeks presented to MAU unannounced c/o ctx since 0800.  She denies vb orlof w/+FM.   History     Patient Active Problem List   Diagnosis Date Noted  . Pain of round ligament 07/03/2014  . Vaginal delivery 09/29/2013  . 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 09/28/2013  . 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 History of seizures as a child 09/28/2013  . Dermatitis 02/06/2012  . Abnormal Pap smear 09/29/2011  . Irregular periods/menstrual cycles 09/29/2011    Chief Complaint  Patient presents with  . Labor Eval   HPI  OB History    Gravida Para Term Preterm AB TAB SAB Ectopic Multiple Living   3 2 2  0 0 0 0 0 0 2      Past Medical History  Diagnosis Date  . H/O varicella   . H/O rubella   . Abnormal Pap smear 2011  . Hepatitis A 2000    after trip to [place]  . History of positive PPD, treatment status unknown     Past Surgical History  Procedure Laterality Date  . Colposcopy  3-13    Family History  Problem Relation Age of Onset  . COPD Paternal Grandfather   . Anemia Mother     History  Substance Use Topics  . Smoking status: Never Smoker   . Smokeless tobacco: Never Used  . Alcohol Use: No    Allergies:  Allergies  Allergen Reactions  . Motrin [Ibuprofen] Rash  . Latex Rash    Prescriptions prior to admission  Medication Sig Dispense Refill Last Dose  . vitamin B-12 (CYANOCOBALAMIN) 100 MCG tablet Take 100 mcg by mouth daily.     Marland Kitchen. loratadine (CLARITIN) 10 MG tablet Take 10 mg by mouth daily as needed for allergies.   Past Week at Unknown time  . Prenatal Vit-Fe Fumarate-FA (PRENATAL MULTIVITAMIN) TABS Take 1 tablet by mouth daily at 12 noon.    07/03/2014 at Unknown time    ROS See HPI above, all other systems are negative  Physical Exam   Blood pressure 110/69, pulse 82, resp. rate 18, height 5\' 2"  (1.575 m), weight 188 lb (85.276 kg), unknown if  currently breastfeeding.  Physical Exam Ext:  WNL ABD: Soft, non tender to palpation, no rebound or guarding SVE: 1/60/-2   ED Course  Assessment: IUP at  41.0 weeks Membranes: intact FHR: Category 1 CTX:  6 minutes   Plan: Pt given the option to wait and be check in one hour or go home and come back.  Pt elected to be check in one hour   Baine Decesare, CNM, MSN 12/24/2014. 4:01 AM  MAU Addendum Note Pt report ctx have decreased in intensity and have spaced out VE 1-2/60-2 Cath 1 tracing Pt elected to go home and return when ctx are stronger  Plan:  -2 percocet prior to discharge -Discussed need to follow up in office tomorrow  -Bleeding and labor Precautions -Encouraged to call if any questions or concerns arise prior to next scheduled office visit.  -Discharged to home in stable condition     Ariyon Gerstenberger, CNM, MSN 12/24/2014. 4:01 AM

## 2014-12-24 NOTE — MAU Note (Signed)
Pt not in lobby.  

## 2014-12-24 NOTE — MAU Note (Signed)
Pt states contractions every 8 mins. Denies LOF, some light brown discharge when wiping. +FM. Plans waterbirth.

## 2014-12-25 LAB — RPR: RPR Ser Ql: NONREACTIVE

## 2014-12-25 LAB — CBC
HCT: 36 % (ref 36.0–46.0)
HEMOGLOBIN: 11.9 g/dL — AB (ref 12.0–15.0)
MCH: 29.4 pg (ref 26.0–34.0)
MCHC: 33.1 g/dL (ref 30.0–36.0)
MCV: 88.9 fL (ref 78.0–100.0)
Platelets: 219 10*3/uL (ref 150–400)
RBC: 4.05 MIL/uL (ref 3.87–5.11)
RDW: 15 % (ref 11.5–15.5)
WBC: 10.6 10*3/uL — ABNORMAL HIGH (ref 4.0–10.5)

## 2014-12-25 LAB — HIV ANTIBODY (ROUTINE TESTING W REFLEX): HIV Screen 4th Generation wRfx: NONREACTIVE

## 2014-12-25 MED ORDER — OXYCODONE HCL 5 MG PO TABS: 5.0000 mg | ORAL_TABLET | ORAL | Status: DC | PRN

## 2014-12-25 NOTE — Discharge Summary (Signed)
  Vaginal Delivery Discharge Summary  Isabella Watkins  DOB:    11/13/1985 MRN:    [id number] CSN:    [id number]  Date of admission:                  12/24/14  Date of discharge:                   12/25/14  Procedures this admission:   SVB  Date of Delivery: 12/24/14 Newborn Data:  Live born female  Birth Weight: 6 lb 14.9 oz (3145 g) APGAR: 9, 9  Home with mother. Name: Isabella Watkins   History of Present Illness:  Ms. Isabella Watkins is a 29 y.o. female, G3P3003, who presents at 7247w0d weeks gestation. [facility] She was admitted for onset of labor. Her pregnancy has been complicated by:  Patient Active Problem List   Diagnosis Date Noted  . Intrahepatic cholestasis of pregnancy--presumptive at 29 weeks, but resolved by 33 weeks with normal labs 12/24/2014  . Vaginal delivery 09/29/2013  . 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 09/28/2013  . 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 History of seizures as a child 09/28/2013  . Dermatitis 02/06/2012  . Abnormal Pap smear 09/29/2011  . Irregular periods/menstrual cycles 09/29/2011     Hospital Course:   Admitting Dx:  IUP at 41 weeks, active labor GBS Status:  Negative Delivering Clinician: Nigel BridgemanVicki Kerington Hildebrant, CNM Lacerations/MLE: None Complications: None  Intrapartum Procedures: spontaneous vaginal delivery Postpartum Procedures: none Complications-Operative and Postpartum: none  Discharge Diagnoses: Term Pregnancy-delivered  Feeding:  breast  Contraception:  no method  Hemoglobin Results:  CBC Latest Ref Rng 12/25/2014 12/24/2014 09/29/2013  WBC 4.0 - 10.5 K/uL 10.6(H) 11.5(H) 10.9(H)  Hemoglobin 12.0 - 15.0 g/dL 11.9(L) 13.7 10.5(L)  Hematocrit 36.0 - 46.0 % 36.0 39.8 31.4(L)  Platelets 150 - 400 K/uL 219 266 227    Discharge Physical Exam:   General: alert Lochia:  appropriate Uterine Fundus: firm Incision: Intact DVT Evaluation: No evidence of DVT seen on physical exam. Negative Homan's sign.   Discharge Information:  Activity:           pelvic rest Diet:                routine Medications: Oxycodone and Tylenol Condition:      stable Instructions:  Routine pp instructions   Discharge to: home  Follow-up Information    [facility]. Schedule an appointment as soon as possible for a visit in 6 weeks.   Specialty:  Obstetrics and Gynecology   Why:  Call with any questions or concerns.   Contact information:   [address] [place] [id number] [phone]       Nigel BridgemanLATHAM, [facility] 12/25/2014 8:58 AM

## 2014-12-25 NOTE — Lactation Note (Signed)
This note was copied from the chart of Isabella Debroah Looprika Facchini. Lactation Consultation Note Experienced BF mom BF her 1st child who is now 333 yrs old for 1 yr. BF her 2nd child who is now 89 yr old for 6 months. Plans on BF this baby for 1 yr. Currently BF STS in cradle position, baby just had a bath. Adjusted flange to baby, mom stated felt better. Encouraged comfort during BF. Mom encouraged to feed baby 8-12 times/24 hours and with feeding cues. Mom encouraged to waken baby for feeds. Mom reports + breast changes w/pregnancy. Educated about newborn behavior, cluster feeding, I&O, supply and demand. Encouraged to call for assistance if needed and to verify proper latch.Referred to Baby and Me Book in Breastfeeding section Pg. 22-23 for position options and Proper latch demonstration.[facility]/LC brochure given w/resources, support groups and LC services. Patient Name: Isabella Watkins'UToday's Date: 12/25/2014 Reason for consult: Initial assessment   Maternal Data Has patient been taught Hand Expression?: Yes Does the patient have breastfeeding experience prior to this delivery?: Yes  Feeding Feeding Type: Breast Fed Length of feed: 10 min (still BF)  LATCH Score/Interventions Latch: Grasps breast easily, tongue down, lips flanged, rhythmical sucking.  Audible Swallowing: None Intervention(s): Skin to skin;Hand expression  Type of Nipple: Everted at rest and after stimulation  Comfort (Breast/Nipple): Soft / non-tender     Hold (Positioning): No assistance needed to correctly position infant at breast. Intervention(s): Skin to skin;Position options;Support Pillows;Breastfeeding basics reviewed  LATCH Score: 8  Lactation Tools Discussed/Used     Consult Status Consult Status: Follow-up Date: 12/26/14 Follow-up type: In-patient    Kahley Leib, Diamond NickelLAURA G 12/25/2014, 1:31 AM

## 2014-12-25 NOTE — Discharge Instructions (Signed)

## 2015-07-24 ENCOUNTER — Other Ambulatory Visit: Payer: Self-pay | Admitting: Physician Assistant

## 2015-07-24 DIAGNOSIS — R109 Unspecified abdominal pain: Secondary | ICD-10-CM

## 2015-08-07 ENCOUNTER — Ambulatory Visit
Admission: RE | Admit: 2015-08-07 | Discharge: 2015-08-07 | Disposition: A | Discharge status: Home / Self Care | Payer: BLUE CROSS/BLUE SHIELD | Source: Ambulatory Visit | Attending: Physician Assistant | Admitting: Physician Assistant

## 2015-08-07 DIAGNOSIS — R109 Unspecified abdominal pain: Secondary | ICD-10-CM

## 2016-04-07 ENCOUNTER — Ambulatory Visit (INDEPENDENT_AMBULATORY_CARE_PROVIDER_SITE_OTHER): Payer: BLUE CROSS/BLUE SHIELD | Admitting: Physician Assistant

## 2016-04-07 VITALS — BP 96/68 | HR 74 | Temp 98.2°F | Resp 17 | Ht 63.5 in | Wt 143.0 lb

## 2016-04-07 DIAGNOSIS — R350 Frequency of micturition: Secondary | ICD-10-CM

## 2016-04-07 DIAGNOSIS — R21 Rash and other nonspecific skin eruption: Secondary | ICD-10-CM

## 2016-04-07 DIAGNOSIS — R252 Cramp and spasm: Secondary | ICD-10-CM

## 2016-04-07 DIAGNOSIS — R079 Chest pain, unspecified: Secondary | ICD-10-CM

## 2016-04-07 LAB — POCT URINALYSIS DIP (MANUAL ENTRY)
BILIRUBIN UA: NEGATIVE
GLUCOSE UA: NEGATIVE
Ketones, POC UA: NEGATIVE
Leukocytes, UA: NEGATIVE
Nitrite, UA: NEGATIVE
Protein Ur, POC: NEGATIVE
Spec Grav, UA: 1.015
Urobilinogen, UA: 0.2
pH, UA: 7.5

## 2016-04-07 LAB — POCT SKIN KOH: SKIN KOH, POC: NEGATIVE

## 2016-04-07 LAB — POC MICROSCOPIC URINALYSIS (UMFC): Mucus: ABSENT

## 2016-04-07 MED ORDER — CYCLOBENZAPRINE HCL 5 MG PO TABS: 5.0000 mg | ORAL_TABLET | Freq: Three times a day (TID) | ORAL | 0 refills | Status: DC | PRN

## 2016-04-07 MED ORDER — MUPIROCIN 2 % EX OINT: 1.0000 "application " | TOPICAL_OINTMENT | Freq: Three times a day (TID) | CUTANEOUS | 0 refills | Status: AC

## 2016-04-07 NOTE — Progress Notes (Deleted)
   Isabella Watkins  MRN: [id number] DOB: 02/24/1986  Subjective:  Patient is a 30 y.o. female who presents for annual physical exam and left scapula/shoulder pain.  Last annual exam: 2006 Last dental exam: 2015 Last vision exam: 2016 Last pap smear: 03/16/2012 Vaccinations      Tetanus: 07/23/2005      HPV: 2011   Patient Active Problem List   Diagnosis Date Noted  . Intrahepatic cholestasis of pregnancy--presumptive at 29 weeks, but resolved by 33 weeks with normal labs 12/24/2014  . Vaginal delivery 09/29/2013  . 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 09/28/2013  . 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 History of seizures as a child 09/28/2013  . Dermatitis 02/06/2012  . Abnormal Pap smear 09/29/2011  . Irregular periods/menstrual cycles 09/29/2011    No current outpatient prescriptions on file prior to visit.   No current facility-administered medications on file prior to visit.     Allergies  Allergen Reactions  . Motrin [Ibuprofen] Rash  . Latex Rash    Social History   Social History  . Marital status: Married    Spouse name: N/A  . Number of children: N/A  . Years of education: N/A   Social History Main Topics  . Smoking status: Never Smoker  . Smokeless tobacco: Never Used  . Alcohol use No  . Drug use: No  . Sexual activity: Yes    Partners: Male    Birth control/ protection: None   Other Topics Concern  . None   Social History Narrative  . None    Past Surgical History:  Procedure Laterality Date  . COLPOSCOPY  3-13    Family History  Problem Relation Age of Onset  . COPD Paternal Grandfather   . Anemia Mother   . Cancer Mother     Review of Systems  Constitutional: Positive for fatigue.  Respiratory: Positive for shortness of breath.   Cardiovascular: Positive for chest pain ( left side, radiates to left arm ).   Objective:  BP 96/68 (BP Location: Right Arm, Patient Position: Sitting,  Cuff Size: Normal)   Pulse 74   Temp 98.2 F (36.8 C) (Oral)   Resp 17   Ht 5' 3.5" (1.613 m)   Wt 143 lb (64.9 kg)   LMP 03/20/2016   SpO2 100%   BMI 24.93 kg/m   Physical Exam  Visual Acuity Screening   Right eye Left eye Both eyes  Without correction: 20/40 20/30 20/30   With correction:       Assessment and Plan :  Discussed healthy lifestyle, diet, exercise, preventative care, vaccinations, and addressed patient's concerns. Plan for follow up in ***. Otherwise, plan for specific conditions below.  There are no diagnoses linked to this encounter.  Benjiman CoreBrittany Hildreth Orsak PA-C  [facility] 04/07/2016 1:24 PM

## 2016-04-07 NOTE — Patient Instructions (Addendum)
  Use cream to affected area three times a day for the next 10 days. If it worsens, stop the cream.   Use flexeril TID for the next week. It can make you drowsy so do not try to go to work with it.   Use ice on your chest for the next week.   Follow up in one week for annual physical exam.  Costochondritis Costochondritis is a condition in which the tissue (cartilage) that connects your ribs with your breastbone (sternum) becomes irritated. It causes pain in the chest and rib area. It usually goes away on its own over time. HOME CARE  Avoid activities that wear you out.  Do not strain your ribs. Avoid activities that use your:  Chest.  Belly.  Side muscles.  Put ice on the area for the first 2 days after the pain starts.  Put ice in a plastic bag.  Place a towel between your skin and the bag.  Leave the ice on for 20 minutes, 2-3 times a day.  Only take medicine as told by your doctor. GET HELP IF:  You have redness or puffiness (swelling) in the rib area.  Your pain does not go away with rest or medicine. GET HELP RIGHT AWAY IF:   Your pain gets worse.  You are very uncomfortable.  You have trouble breathing.  You cough up blood.  You start sweating or throwing up (vomiting).  You have a fever or lasting symptoms for more than 2-3 days.  You have a fever and your symptoms suddenly get worse. MAKE SURE YOU:   Understand these instructions.  Will watch your condition.  Will get help right away if you are not doing well or get worse.   This information is not intended to replace advice given to you by your health care provider. Make sure you discuss any questions you have with your health care provider.   Document Released: 11/23/2007 Document Revised: 02/06/2013 Document Reviewed: 01/08/2013 Elsevier Interactive Patient Education 2016 [employer]    IF you received an x-ray today, you will receive an invoice from [facility]. Please contact  [facility] at [phone] with questions or concerns regarding your invoice.   IF you received labwork today, you will receive an invoice from [facility]. Please contact [facility] at [phone] with questions or concerns regarding your invoice.   Our billing staff will not be able to assist you with questions regarding bills from these companies.  You will be contacted with the lab results as soon as they are available. The fastest way to get your results is to activate your My Chart account. Instructions are located on the last page of this paperwork. If you have not heard from us regarding the results in 2 weeks, please contact this office.

## 2016-04-07 NOTE — Progress Notes (Signed)
Isabella Watkins  MRN: [id number] DOB: [birth year]  Subjective:  Isabella Watkins is a 30 y.o. female seen in office today for a chief complaint of sharp intermittent chest pain x 2 weeks. Has assoicated radiating pain to left arm and shoulder, palpitations, and SOB. Denies exertional chest pain, diaphoresis, nausea, vomiting, lower leg swelling, cough, and acute injury. Denies smoking. Does not her know family history.   She also has a rash on her buttocks on and off for the past few months. Has associated pruritis. Denies pain, pus drainage, swelling, and redness. Notes that coritsone cream helps with the itching but she just cannot seem to get the rash to go away completely. She admits to wearing tight fitting clothes.  Review of Systems  Constitutional: Negative for chills, diaphoresis and fever.  Genitourinary: Positive for frequency ( for the past few days). Negative for difficulty urinating, dysuria and hematuria.  Neurological: Positive for headaches. Negative for dizziness and weakness.    Patient Active Problem List   Diagnosis Date Noted  . 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 History of seizures as a child 09/28/2013  . Dermatitis 02/06/2012  . Abnormal Pap smear 09/29/2011    No current outpatient prescriptions on file prior to visit.   No current facility-administered medications on file prior to visit.     Allergies  Allergen Reactions  . Motrin [Ibuprofen] Rash  . Latex Rash   Social History   Social History  . Marital status: Married    Spouse name: N/A  . Number of children: N/A  . Years of education: N/A   Occupational History  . Not on file.   Social History Main Topics  . Smoking status: Never Smoker  . Smokeless tobacco: Never Used  . Alcohol use No  . Drug use: No  . Sexual activity: Yes    Partners: Male    Birth control/ protection: None   Other Topics Concern  . Not on file   Social History Narrative  . No narrative on  file    Objective:  BP 96/68 (BP Location: Right Arm, Patient Position: Sitting, Cuff Size: Normal)   Pulse 74   Temp 98.2 F (36.8 C) (Oral)   Resp 17   Ht 5' 3.5" (1.613 m)   Wt 143 lb (64.9 kg)   LMP 03/20/2016   SpO2 100%   BMI 24.93 kg/m   Physical Exam  Constitutional: She is oriented to person, place, and time and well-developed, well-nourished, and in no distress.  HENT:  Head: Normocephalic and atraumatic.  Eyes: Conjunctivae are normal.  Neck: Normal range of motion.  Cardiovascular: Normal rate, regular rhythm and normal pulses.     Pulmonary/Chest: Effort normal.  Musculoskeletal:       Right shoulder: Normal.       Left shoulder: Normal.       Right elbow: Normal.      Left elbow: Normal.       Right wrist: Normal.       Left wrist: Normal.       Cervical back: She exhibits spasm (left trapezius). She exhibits normal range of motion, no tenderness and no bony tenderness.  Neurological: She is alert and oriented to person, place, and time. Gait normal.  Skin: Skin is warm and dry.     Psychiatric: Affect normal.  Vitals reviewed.   Results for orders placed or performed in visit on 04/07/16 (from the past 24 hour(s))  POCT Skin KOH  Status: None   Collection Time: 04/07/16  2:15 PM  Result Value Ref Range   Skin KOH, POC Negative   POCT Microscopic Urinalysis ([facility])     Status: Abnormal   Collection Time: 04/07/16  2:31 PM  Result Value Ref Range   WBC,UR,HPF,POC None None WBC/hpf   RBC,UR,HPF,POC Few (A) None RBC/hpf   Bacteria None None, Too numerous to count   Mucus Absent Absent   Epithelial Cells, UR Per Microscopy Few (A) None, Too numerous to count cells/hpf  POCT urinalysis dipstick     Status: Abnormal   Collection Time: 04/07/16  2:31 PM  Result Value Ref Range   Color, UA yellow yellow   Clarity, UA clear clear   Glucose, UA negative negative   Bilirubin, UA negative negative   Ketones, POC UA negative negative   Spec Grav, UA  1.015    Blood, UA trace-intact (A) negative   pH, UA 7.5    Protein Ur, POC negative negative   Urobilinogen, UA 0.2    Nitrite, UA Negative Negative   Leukocytes, UA Negative Negative   EKG shows sinus rhythm at 67 bpm with no acute ST or T wave abnormalities. EKG findings presented to Dr. Katrinka BlazingSmith.   Assessment and Plan :  1. Chest pain, unspecified type -History and physical exam findings consistent with costochondritis. Pt is allergic to NSAIDs, recommend ice and rest - EKG 12-Lead -Return to clinic if symptoms worsen or do not improve  2. Rash and nonspecific skin eruption -Physical exam findings consistent with folliculitis - POCT Skin KOH - mupirocin ointment (BACTROBAN) 2 %; Place 1 application into the nose 3 (three) times daily.  Dispense: 22 g; Refill: 0  3. Urinary frequency - POCT Microscopic Urinalysis ([facility]) - POCT urinalysis dipstick  4. Spasm - cyclobenzaprine (FLEXERIL) 5 MG tablet; Take 1 tablet (5 mg total) by mouth 3 (three) times daily as needed for muscle spasms.  Dispense: 60 tablet; Refill: 0  -Follow up in one week for annual physical exam.   Benjiman CoreBrittany Ari Engelbrecht PA-C  [facility] 04/07/2016 2:44 PM

## 2016-04-14 ENCOUNTER — Ambulatory Visit: Payer: BLUE CROSS/BLUE SHIELD

## 2016-04-16 ENCOUNTER — Ambulatory Visit (INDEPENDENT_AMBULATORY_CARE_PROVIDER_SITE_OTHER): Payer: BLUE CROSS/BLUE SHIELD | Admitting: Family Medicine

## 2016-04-16 VITALS — BP 112/68 | HR 82 | Temp 98.2°F | Resp 16 | Ht 63.5 in | Wt 146.0 lb

## 2016-04-16 DIAGNOSIS — N926 Irregular menstruation, unspecified: Secondary | ICD-10-CM | POA: Diagnosis not present

## 2016-04-16 DIAGNOSIS — Z0000 Encounter for general adult medical examination without abnormal findings: Secondary | ICD-10-CM | POA: Diagnosis not present

## 2016-04-16 DIAGNOSIS — Z124 Encounter for screening for malignant neoplasm of cervix: Secondary | ICD-10-CM | POA: Diagnosis not present

## 2016-04-16 LAB — COMPREHENSIVE METABOLIC PANEL
ALBUMIN: 4.2 g/dL (ref 3.6–5.1)
ALK PHOS: 66 U/L (ref 33–115)
ALT: 15 U/L (ref 6–29)
AST: 17 U/L (ref 10–30)
BUN: 9 mg/dL (ref 7–25)
CO2: 27 mmol/L (ref 20–31)
Calcium: 8.8 mg/dL (ref 8.6–10.2)
Chloride: 105 mmol/L (ref 98–110)
Creat: 0.58 mg/dL (ref 0.50–1.10)
Glucose, Bld: 94 mg/dL (ref 65–99)
POTASSIUM: 4.2 mmol/L (ref 3.5–5.3)
Sodium: 138 mmol/L (ref 135–146)
TOTAL PROTEIN: 7.2 g/dL (ref 6.1–8.1)
Total Bilirubin: 0.5 mg/dL (ref 0.2–1.2)

## 2016-04-16 LAB — POCT URINE PREGNANCY: Preg Test, Ur: NEGATIVE

## 2016-04-16 LAB — LIPID PANEL
Cholesterol: 145 mg/dL (ref 125–200)
HDL: 79 mg/dL (ref >=46)
LDL CALC: 59 mg/dL (ref <130)
TRIGLYCERIDES: 37 mg/dL (ref <150)
Total CHOL/HDL Ratio: 1.8 Ratio (ref <=5.0)
VLDL: 7 mg/dL (ref <30)

## 2016-04-16 LAB — TSH: TSH: 1.58 m[IU]/L

## 2016-04-16 LAB — CBC
HCT: 37.4 % (ref 35.0–45.0)
Hemoglobin: 12.6 g/dL (ref 11.7–15.5)
MCH: 30.4 pg (ref 27.0–33.0)
MCHC: 33.7 g/dL (ref 32.0–36.0)
MCV: 90.3 fL (ref 80.0–100.0)
MPV: 9.4 fL (ref 7.5–12.5)
PLATELETS: 216 10*3/uL (ref 140–400)
RBC: 4.14 MIL/uL (ref 3.80–5.10)
RDW: 12.4 % (ref 11.0–15.0)
WBC: 5.8 10*3/uL (ref 3.8–10.8)

## 2016-04-16 NOTE — Progress Notes (Deleted)
  Chief Complaint  Patient presents with  . Annual Exam    pap smear    HPI  Past Medical History:  Diagnosis Date  . Abnormal Pap smear 2011  . H/O rubella   . H/O varicella   . Hepatitis A 2000   after trip to [place]  . History of positive PPD, treatment status unknown     Current Outpatient Prescriptions  Medication Sig Dispense Refill  . cyclobenzaprine (FLEXERIL) 5 MG tablet Take 1 tablet (5 mg total) by mouth 3 (three) times daily as needed for muscle spasms. 60 tablet 0  . mupirocin ointment (BACTROBAN) 2 % Place 1 application into the nose 3 (three) times daily. 22 g 0   No current facility-administered medications for this visit.     Allergies:  Allergies  Allergen Reactions  . Motrin [Ibuprofen] Rash  . Latex Rash    Past Surgical History:  Procedure Laterality Date  . COLPOSCOPY  3-13    Social History   Social History  . Marital status: Married    Spouse name: N/A  . Number of children: N/A  . Years of education: N/A   Social History Main Topics  . Smoking status: Never Smoker  . Smokeless tobacco: Never Used  . Alcohol use No  . Drug use: No  . Sexual activity: Yes    Partners: Male    Birth control/ protection: None   Other Topics Concern  . None   Social History Narrative  . None    ROS  Objective: Vitals:   04/16/16 1541  BP: 112/68  Pulse: 82  Resp: 16  Temp: 98.2 F (36.8 C)  TempSrc: Oral  SpO2: 100%  Weight: 146 lb (66.2 kg)  Height: 5' 3.5" (1.613 m)    Physical Exam  Assessment and Plan Cicero Duckrika was seen today for annual exam.  Diagnoses and all orders for this visit:  Annual physical exam -     Lipid panel -     Comprehensive metabolic panel -     TSH -     CBC  Irregular menses -     POCT urine pregnancy  Pap smear for cervical cancer screening -     Pap IG and HPV (high risk) DNA detection     Kyonna Frier A Dyshawn Cangelosi

## 2016-04-16 NOTE — Patient Instructions (Signed)
     IF you received an x-ray today, you will receive an invoice from [facility]. Please contact [facility] at [phone] with questions or concerns regarding your invoice.   IF you received labwork today, you will receive an invoice from [facility]. Please contact [facility] at [phone] with questions or concerns regarding your invoice.   Our billing staff will not be able to assist you with questions regarding bills from these companies.  You will be contacted with the lab results as soon as they are available. The fastest way to get your results is to activate your My Chart account. Instructions are located on the last page of this paperwork. If you have not heard from us regarding the results in 2 weeks, please contact this office.      

## 2016-04-16 NOTE — Progress Notes (Signed)
Chief Complaint  Patient presents with  . Annual Exam    pap smear    Subjective:  Isabella Watkins is a 30 y.o. female here for a health maintenance visit.  Patient is established pt  Patient Active Problem List   Diagnosis Date Noted  . 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 History of seizures as a child 09/28/2013  . Dermatitis 02/06/2012  . Abnormal Pap smear 09/29/2011    Past Medical History:  Diagnosis Date  . Abnormal Pap smear 2011  . H/O rubella   . H/O varicella   . Hepatitis A 2000   after trip to [place]  . History of positive PPD, treatment status unknown     Past Surgical History:  Procedure Laterality Date  . COLPOSCOPY  3-13     Outpatient Medications Prior to Visit  Medication Sig Dispense Refill  . cyclobenzaprine (FLEXERIL) 5 MG tablet Take 1 tablet (5 mg total) by mouth 3 (three) times daily as needed for muscle spasms. 60 tablet 0  . mupirocin ointment (BACTROBAN) 2 % Place 1 application into the nose 3 (three) times daily. 22 g 0   No facility-administered medications prior to visit.     Allergies  Allergen Reactions  . Motrin [Ibuprofen] Rash  . Latex Rash     Family History  Problem Relation Age of Onset  . COPD Paternal Grandfather   . Anemia Mother   . Cancer Mother      Health Habits: Dental Exam: up to date Eye Exam: up to date Current exercise activities: walking/running  Social History   Social History  . Marital status: Married    Spouse name: N/A  . Number of children: N/A  . Years of education: N/A   Occupational History  . Not on file.   Social History Main Topics  . Smoking status: Never Smoker  . Smokeless tobacco: Never Used  . Alcohol use No  . Drug use: No  . Sexual activity: Yes    Partners: Male    Birth control/ protection: None   Other Topics Concern  . Not on file   Social History Narrative  . No narrative on file   History  Alcohol Use No   History  Smoking Status   . Never Smoker  Smokeless Tobacco  . Never Used   History  Drug Use No    GYN: Sexual Health Menstrual status: regular menses LMP: Patient's last menstrual period was 03/20/2016. Last pap smear: see HM section History of abnormal pap smears: 2013 with [facility] Sexually active: yes with female partner Current contraception: vasectomy  Health Maintenance: See under health Maintenance activity for review of completion dates as well. There is no immunization history for the selected administration types on file for this patient.    Depression Screen-PHQ2/9 Depression screen [facility] 04/16/2016 04/07/2016  Decreased Interest 0 0  Down, Depressed, Hopeless 0 0  PHQ - 2 Score 0 0      Depression Severity and Treatment Recommendations:  0-4= None  5-9= Mild / Treatment: Support, educate to call if worse; return in one month  10-14= Moderate / Treatment: Support, watchful waiting; Antidepressant or Psycotherapy  15-19= Moderately severe / Treatment: Antidepressant OR Psychotherapy  >= 20 = Major depression, severe / Antidepressant AND Psychotherapy    Review of Systems   Review of Systems  Constitutional: Negative for chills, fever and weight loss.  HENT: Negative for congestion.   Respiratory: Negative for cough and wheezing.  Cardiovascular: Negative for chest pain and palpitations.  Gastrointestinal: Negative for heartburn, nausea and vomiting.       Intermittent constipation and diarrhea  Genitourinary: Negative for dysuria and hematuria.  Skin: Negative for itching and rash.  Neurological: Negative for dizziness, tingling, tremors and headaches.  Psychiatric/Behavioral: Negative for depression. The patient is not nervous/anxious.     See HPI for ROS as well.    Objective:   Vitals:   04/16/16 1541  BP: 112/68  Pulse: 82  Resp: 16  Temp: 98.2 F (36.8 C)  TempSrc: Oral  SpO2: 100%  Weight: 146 lb (66.2 kg)  Height: 5' 3.5" (1.613 m)    Body mass index is  25.46 kg/m.  Physical Exam  Constitutional: She is oriented to person, place, and time. She appears well-developed and well-nourished.  HENT:  Head: Normocephalic and atraumatic.  Eyes: Conjunctivae and EOM are normal.  Neck: Normal range of motion. Neck supple. No thyromegaly present.  Cardiovascular: Normal rate and regular rhythm.   No murmur heard. Pulmonary/Chest: Effort normal and breath sounds normal. No respiratory distress. She has no wheezes.  Abdominal: Soft. Bowel sounds are normal. She exhibits no distension. There is no tenderness.  Musculoskeletal: Normal range of motion. She exhibits no edema.  Neurological: She is alert and oriented to person, place, and time. No cranial nerve deficit.  Skin: Skin is warm. No erythema.  Psychiatric: She has a normal mood and affect. Her behavior is normal. Judgment and thought content normal.       Assessment/Plan:   Patient was seen for a health maintenance exam.  Counseled the patient on health maintenance issues. Reviewed her health mainteance schedule and ordered appropriate tests (see orders.) Counseled on regular exercise and weight management. Recommend regular eye exams and dental cleaning.   The following issues were addressed today for health maintenance:   Isabella Watkins was seen today for annual exam.  Diagnoses and all orders for this visit:  Annual physical exam- age appropriate screenings reviewed -     Lipid panel -     Comprehensive metabolic panel -     TSH -     CBC  Irregular menses- negative pregnancy test Advised pt to track periods and if they are occurring 21-35 days then that is normal -     POCT urine pregnancy  Pap smear for cervical cancer screening- repeated pap with automatic hpv since pt had an abnormal pap in the past 3 years and will be 30 yo in less than 6 months -     Pap IG and HPV (high risk) DNA detection    No Follow-up on file.    Body mass index is 25.46 kg/m.:  Discussed the  patient's BMI with patient. The BMI body mass index is 25.46 kg/m.    No future appointments.  Patient Instructions       IF you received an x-ray today, you will receive an invoice from [facility]. Please contact [facility] at [phone] with questions or concerns regarding your invoice.   IF you received labwork today, you will receive an invoice from [facility]. Please contact [facility] at [phone] with questions or concerns regarding your invoice.   Our billing staff will not be able to assist you with questions regarding bills from these companies.  You will be contacted with the lab results as soon as they are available. The fastest way to get your results is to activate your My Chart account. Instructions are located  on the last page of this paperwork. If you have not heard from [place] regarding the results in 2 weeks, please contact this office.

## 2016-04-19 LAB — PAP IG AND HPV HIGH-RISK: HPV DNA High Risk: NOT DETECTED

## 2017-06-03 ENCOUNTER — Encounter: Payer: Self-pay | Admitting: Family Medicine

## 2017-06-03 ENCOUNTER — Other Ambulatory Visit: Payer: Self-pay

## 2017-06-03 ENCOUNTER — Ambulatory Visit: Payer: BLUE CROSS/BLUE SHIELD | Admitting: Family Medicine

## 2017-06-03 ENCOUNTER — Ambulatory Visit (INDEPENDENT_AMBULATORY_CARE_PROVIDER_SITE_OTHER): Payer: BLUE CROSS/BLUE SHIELD

## 2017-06-03 VITALS — BP 122/64 | HR 70 | Temp 98.0°F | Ht 64.0 in | Wt 155.0 lb

## 2017-06-03 DIAGNOSIS — M79672 Pain in left foot: Secondary | ICD-10-CM

## 2017-06-03 DIAGNOSIS — Z23 Encounter for immunization: Secondary | ICD-10-CM

## 2017-06-03 DIAGNOSIS — S92352A Displaced fracture of fifth metatarsal bone, left foot, initial encounter for closed fracture: Secondary | ICD-10-CM | POA: Diagnosis not present

## 2017-06-03 NOTE — Patient Instructions (Addendum)
IF you received an x-ray today, you will receive an invoice from [facility]. Please contact [facility] at [phone] with questions or concerns regarding your invoice.   IF you received labwork today, you will receive an invoice from [facility]. Please contact [facility] at [phone] with questions or concerns regarding your invoice.   Our billing staff will not be able to assist you with questions regarding bills from these companies.  You will be contacted with the lab results as soon as they are available. The fastest way to get your results is to activate your My Chart account. Instructions are located on the last page of this paperwork. If you have not heard from us regarding the results in 2 weeks, please contact this office.     Metatarsal Fracture A metatarsal fracture is a broken bone in one of the five bones that connect your toes to the rest of your foot (forefoot fracture). Metatarsals are long bones that can be stressed or cracked easily. A metatarsal fracture can be:  A stress fracture. Stress fractures are cracks in the surface of the metatarsal bone. Athletes often get stress fractures.  A complete fracture. A complete fracture goes all the way through the bone.  The bone that connects to the pinky toe (fifth metatarsal) is the most commonly fractured metatarsal. Ballet dancers often fracture this bone. What are the causes? This type of fracture may be caused by:  A sudden twisting of your foot.  A fall onto your foot.  Overuse or repetitive exercise.  What increases the risk? This condition is more likely to develop in people who:  Play contact sports.  Have a bone disease.  Have a low calcium level.  What are the signs or symptoms? Symptoms of this condition include:  Pain that is worse when walking or standing.  Pain when pressing on the foot or moving the toes.  Swelling.  Bruising on the top or bottom of the foot.  A  foot that appears shorter than the other one.  How is this diagnosed? This condition is diagnosed with a physical exam. You may also have imaging tests, such as:  X-rays.  A CT scan.  MRI.  How is this treated? Treatment for this condition depends on its severity and whether a bone has moved out of place. Treatment may involve:  Rest.  Wearing foot support such as a cast, splint, or boot for several weeks.  Using crutches.  Surgery to move bones back into the right position. Surgery is usually needed if there are many pieces of broken bone or bones that are very out of place (displaced fracture).  Physical therapy. This may be needed to help you regain full movement and strength in your foot.  You will need to return to your health care provider to have X-rays taken until your bones heal. Your health care provider will look at the X-rays to make sure that your foot is healing well. Follow these instructions at home: If you have a cast:  Do not stick anything inside the cast to scratch your skin. Doing that increases your risk of infection.  Check the skin around the cast every day. Report any concerns to your health care provider. You may put lotion on dry skin around the edges of the cast. Do not apply lotion to the skin underneath the cast.  Keep the cast clean and dry. If you have a splint or a supportive boot:  Wear it as directed  by your health care provider. Remove it only as directed by your health care provider.  Loosen it if your toes become numb and tingle, or if they turn cold and blue.  Keep it clean and dry. Bathing  Do not take baths, swim, or use a hot tub until your health care provider approves. Ask your health care provider if you can take showers. You may only be allowed to take sponge baths for bathing.  If your health care provider approves bathing and showering, cover the cast or splint with a watertight plastic bag to protect it from water. Do not  let the cast or splint get wet. Managing pain, stiffness, and swelling  If directed, apply ice to the injured area (if you have a splint, not a cast). ? Put ice in a plastic bag. ? Place a towel between your skin and the bag. ? Leave the ice on for 20 minutes, 2-3 times per day.  Move your toes often to avoid stiffness and to lessen swelling.  Raise (elevate) the injured area above the level of your heart while you are sitting or lying down. Driving  Do not drive or operate heavy machinery while taking pain medicine.  Do not drive while wearing foot support on a foot that you use for driving. Activity  Return to your normal activities as directed by your health care provider. Ask your health care provider what activities are safe for you.  Perform exercises as directed by your health care provider or physical therapist. Safety  Do not use the injured foot to support your body weight until your health care provider says that you can. Use crutches as directed by your health care provider. General instructions  Do not put pressure on any part of the cast or splint until it is fully hardened. This may take several hours.  Do not use any tobacco products, including cigarettes, chewing tobacco, or e-cigarettes. Tobacco can delay bone healing. If you need help quitting, ask your health care provider.  Take medicines only as directed by your health care provider.  Keep all follow-up visits as directed by your health care provider. This is important. Contact a health care provider if:  You have a fever.  Your cast, splint, or boot is too loose or too tight.  Your cast, splint, or boot is damaged.  Your pain medicine is not helping.  You have pain, tingling, or numbness in your foot that is not going away. Get help right away if:  You have severe pain.  You have tingling or numbness in your foot that is getting worse.  Your foot feels cold or becomes numb.  Your foot changes  color. This information is not intended to replace advice given to you by your health care provider. Make sure you discuss any questions you have with your health care provider. Document Released: 02/26/2002 Document Revised: 02/09/2016 Document Reviewed: 04/02/2014 Elsevier Interactive Patient Education  [employer]2018 [employer]

## 2017-06-03 NOTE — Progress Notes (Signed)
   12/15/20181:56 PM  Ardeth Sportsmanrika V Cambridge 07/06/1985, 31 y.o. female [id number]  Chief Complaint  Patient presents with  . Numbness    left left, pain. Has icing and elevating with no relief    HPI:   Patient is a 31 y.o. female who presents today for 3 weeks of left foot pain. 3 weeks ago she was using the restroom, her leg and gone numb, when she stopped up she did not notice that her toes were curled underneath her foot and she walked/dragging from bathroom to bedroom. First 2 days, could not walk on it, was using crutches, but has been walking on it since. She has not been elevating or icing very much. Not getting better.  [facility] 06/03/2017 04/16/2016 04/07/2016  Decreased Interest 0 0 0  Down, Depressed, Hopeless 0 0 0  PHQ - 2 Score 0 0 0    Allergies  Allergen Reactions  . Motrin [Ibuprofen] Rash  . Latex Rash    Prior to Admission medications   Medication Sig Start Date End Date Taking? Authorizing Provider  None  Past Medical History:  Diagnosis Date  . Abnormal Pap smear 2011  . H/O rubella   . H/O varicella   . Hepatitis A 2000   after trip to [place]  . History of positive PPD, treatment status unknown     Past Surgical History:  Procedure Laterality Date  . COLPOSCOPY  3-13    Social History   Tobacco Use  . Smoking status: Never Smoker  . Smokeless tobacco: Never Used  Substance Use Topics  . Alcohol use: No    Family History  Problem Relation Age of Onset  . COPD Paternal Grandfather   . Anemia Mother   . Cancer Mother     ROS Per hpi  OBJECTIVE:  Blood pressure 122/64, pulse 70, temperature 98 F (36.7 C), temperature source Oral, height 5\' 4"  (1.626 m), weight 155 lb (70.3 kg), SpO2 100 %, unknown if currently breastfeeding. LMP 06/03/17  Physical Exam  Gen: AAOx3, NAD Left LLE: ankle intact, purpulish bruising with swelling across interdigit space and across digits 2-3 with TTP, most TTP over 5th metatarsal. Able to  move toes. Neurovascularly intact.  Dg Foot Complete Left  Result Date: 06/03/2017 CLINICAL DATA:  Bruising and swelling of the foot.  Pain. EXAM: LEFT FOOT - COMPLETE 3+ VIEW COMPARISON:  None. FINDINGS: Minimally displaced transverse fracture of the fifth metatarsal base, with extension to the intertarsal articulation. Soft tissue swelling. IMPRESSION: Minimally displaced intra-articular transverse fracture of the base of the fifth metatarsal bone. Electronically Signed   By: Ted Mcalpineobrinka  Dimitrova M.D.   On: 06/03/2017 14:19   Posterior splint placed by Benjiman CoreBrittany Wiseman, PA-C  ASSESSMENT and PLAN 1. Closed displaced fracture of fifth metatarsal bone of left foot, initial encounter Patient was placed on posterior splint. Discussed NWB - has crutches at home. Elevate foot at home. Ortho referral made. - Ambulatory referral to Orthopedic Surgery  2. Left foot pain - DG Foot Complete Left; Future - Ambulatory referral to Orthopedic Surgery  3. Need for vaccination  Other orders - Td vaccine greater than or equal to 7yo preservative free IM  Return if symptoms worsen or fail to improve.    Myles LippsIrma M Santiago, MD [facility] [address] [place], [place] [address] Ph.  [phone] Fax [phone]

## 2017-08-27 IMAGING — US US ABDOMEN COMPLETE
1 series · 14 of 25 positions shown · non-contrast
Comparison: None.

CLINICAL DATA: Abdominal pain

EXAM:
ABDOMEN ULTRASOUND COMPLETE

[Series 1: us abdomen complete · 0.30mm/px · 14 of 78 slices shown]
[im 1/78]
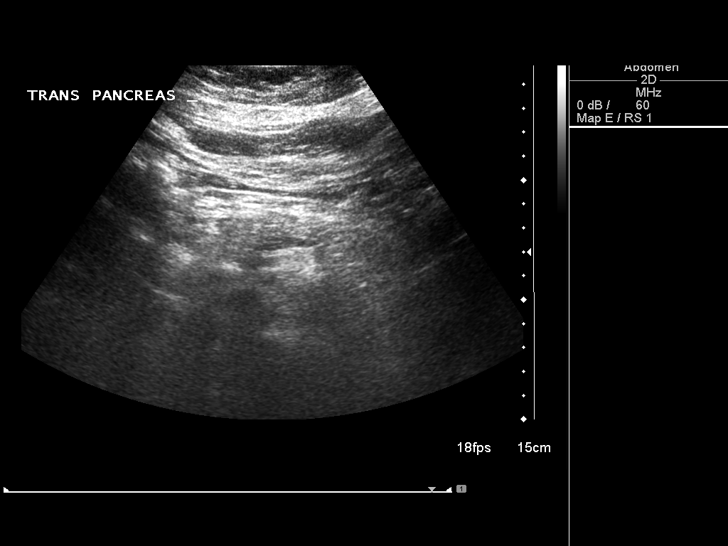
[im 7/78]
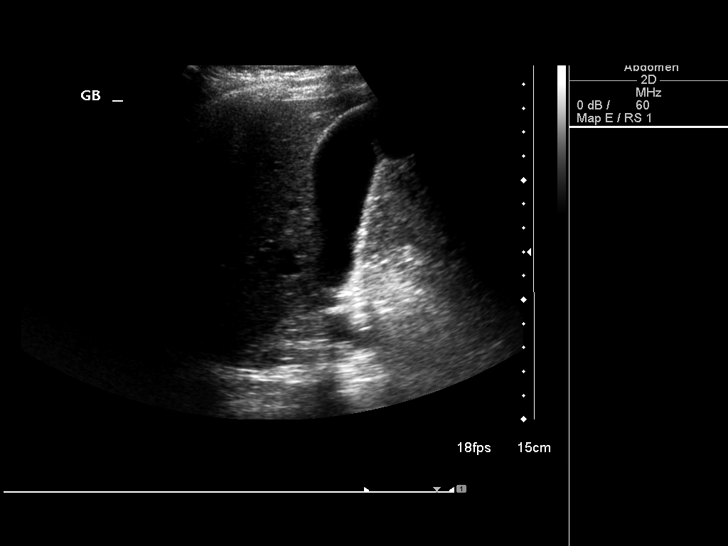
[im 13/78]
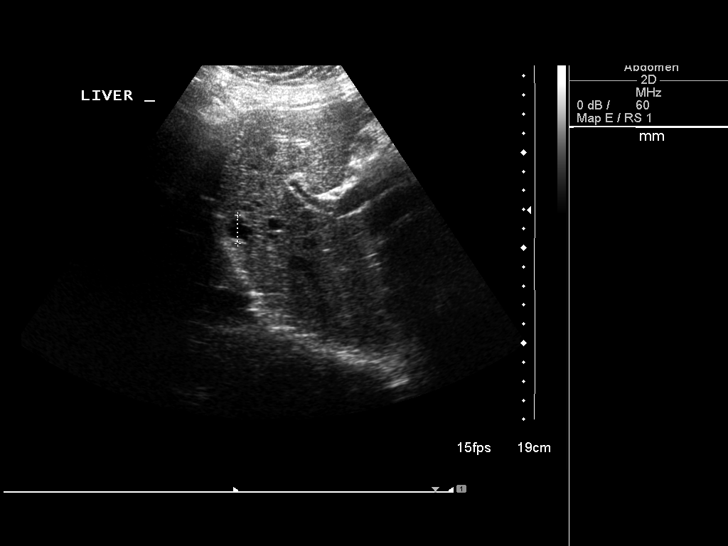
[im 20/78]
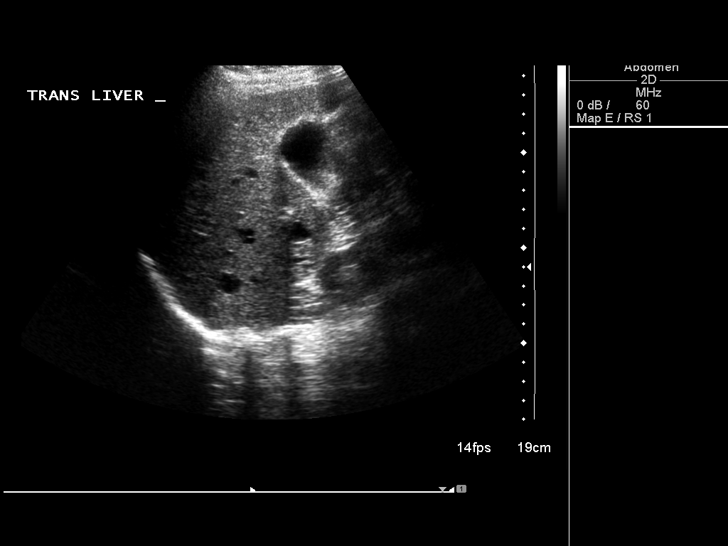
[im 26/78]
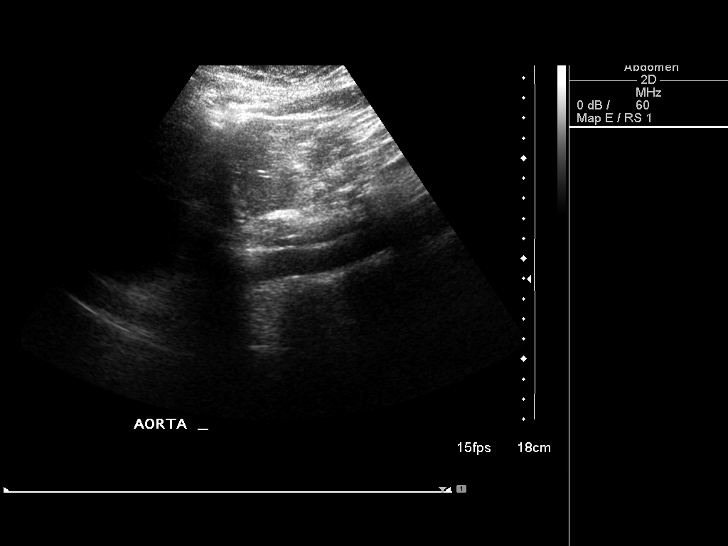
[im 29/78]
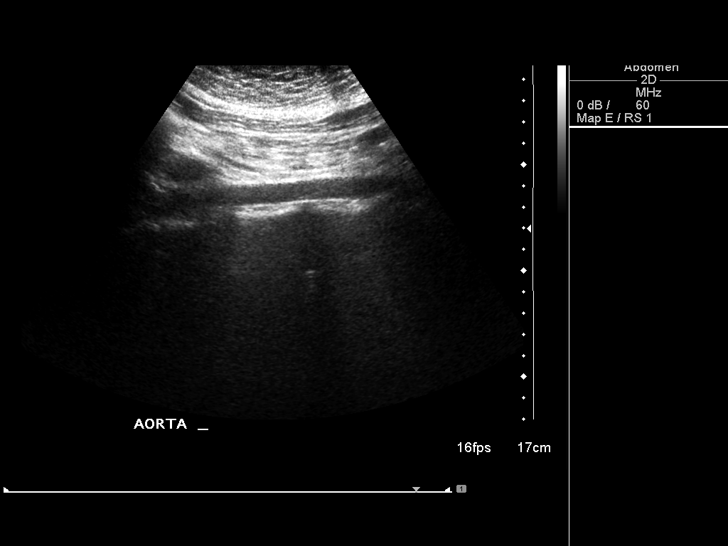
[im 36/78]
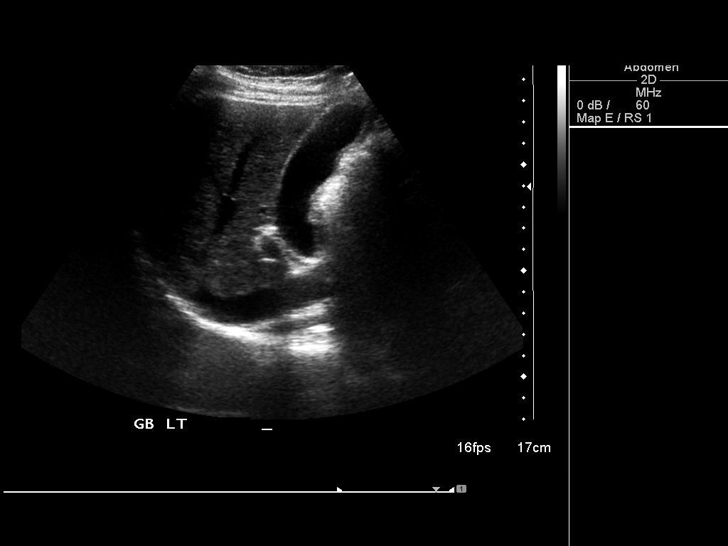
[im 42/78]
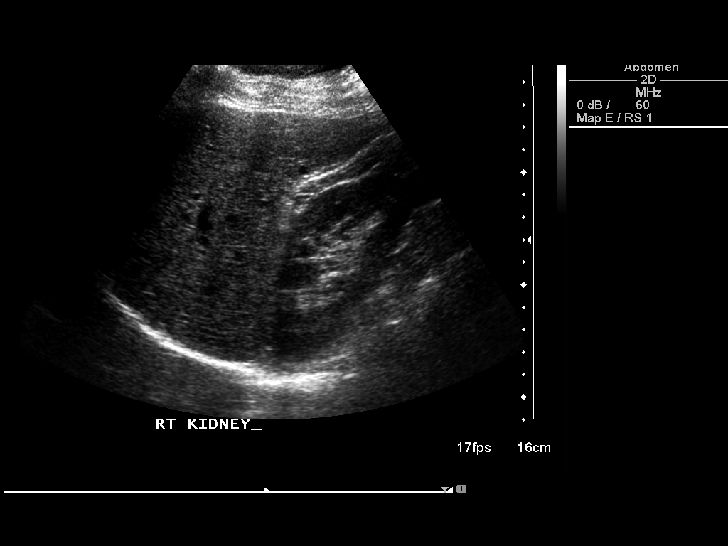
[im 49/78]
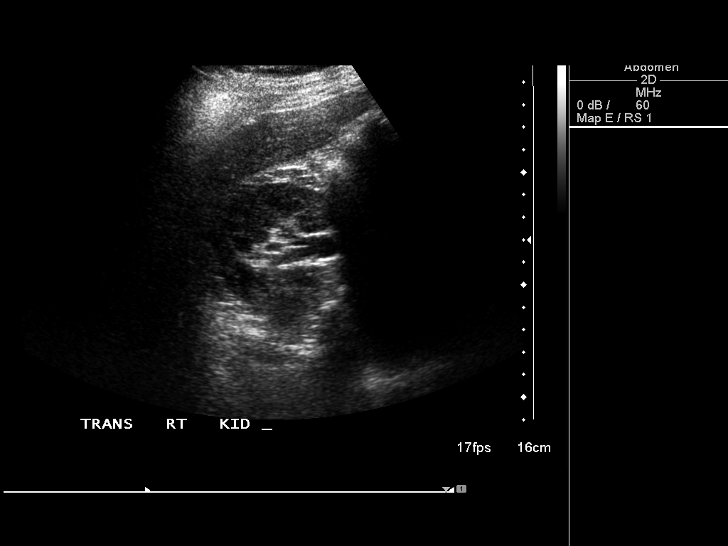
[im 52/78]
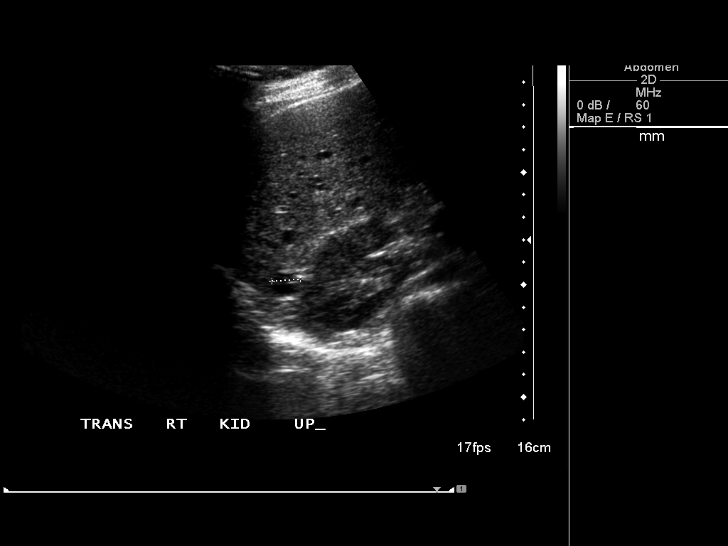
[im 58/78]
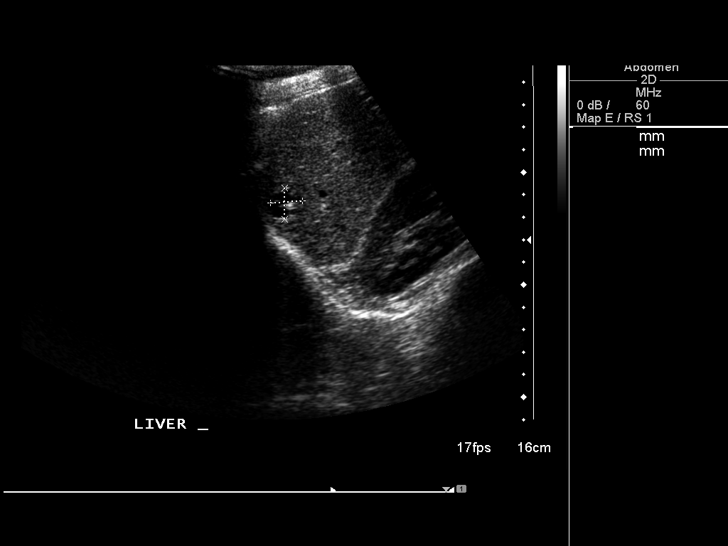
[im 65/78]
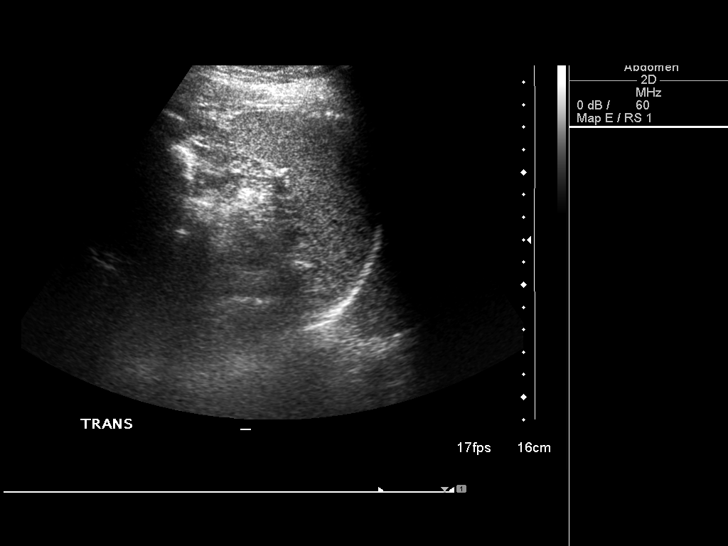
[im 71/78]
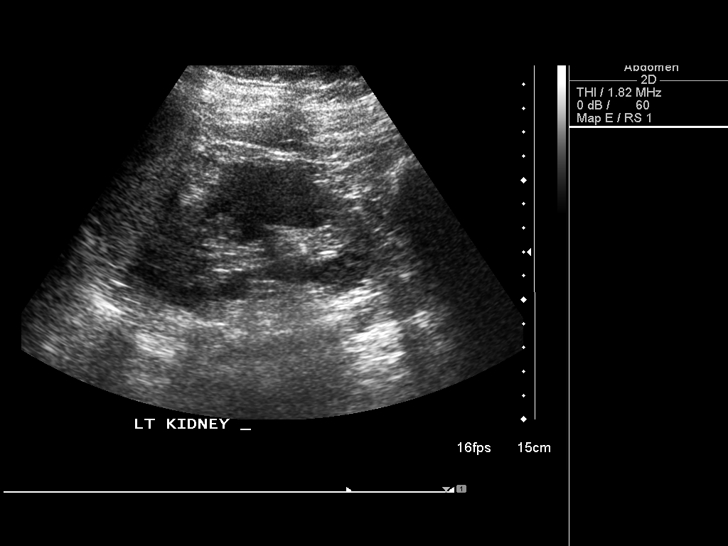
[im 78/78]
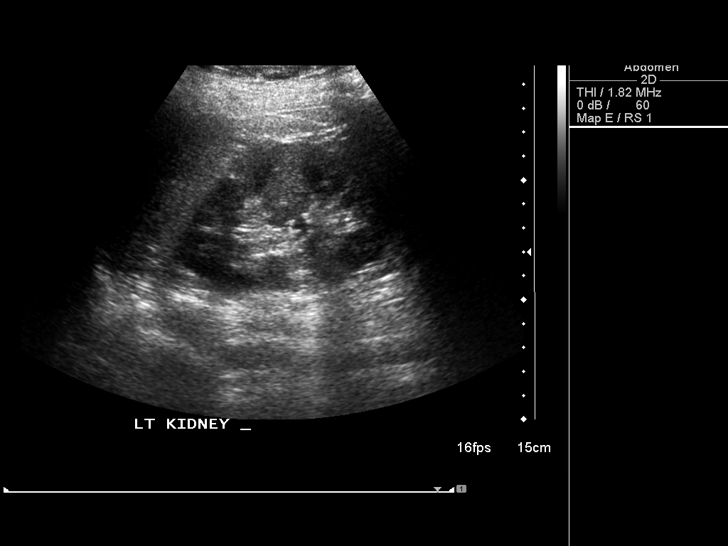

[14 of 25 positions shown; findings below may reference images not displayed]

FINDINGS: Gallbladder: No gallstones or wall thickening visualized. No
sonographic Murphy sign noted by sonographer.

Common bile duct: Diameter: 3.7 mm

Liver: Multiple small anechoic hepatic mass the with large measuring
2.3 x 1.3 x 1.9 cm in the right hepatic lobe likely representing
cysts. Within normal limits in parenchymal echogenicity.

IVC: No abnormality visualized.

Pancreas: Visualized portion unremarkable.

Spleen: Size and appearance within normal limits.

Right Kidney: Length: 9.9 cm. Echogenicity within normal limits. No
mass or hydronephrosis visualized.

Left Kidney: Length: 10.2 cm. Echogenicity within normal limits. No
mass or hydronephrosis visualized.

Abdominal aorta: No aneurysm visualized.

Other findings: None.
IMPRESSION: 1. No cholelithiasis or sonographic evidence of acute cholecystitis.

## 2019-06-24 IMAGING — DX DG FOOT COMPLETE 3+V*L*
3 series · 3 of 3 positions shown · non-contrast
Comparison: None.

CLINICAL DATA: Bruising and swelling of the foot.  Pain.

EXAM:
LEFT FOOT - COMPLETE 3+ VIEW

[foot ap]
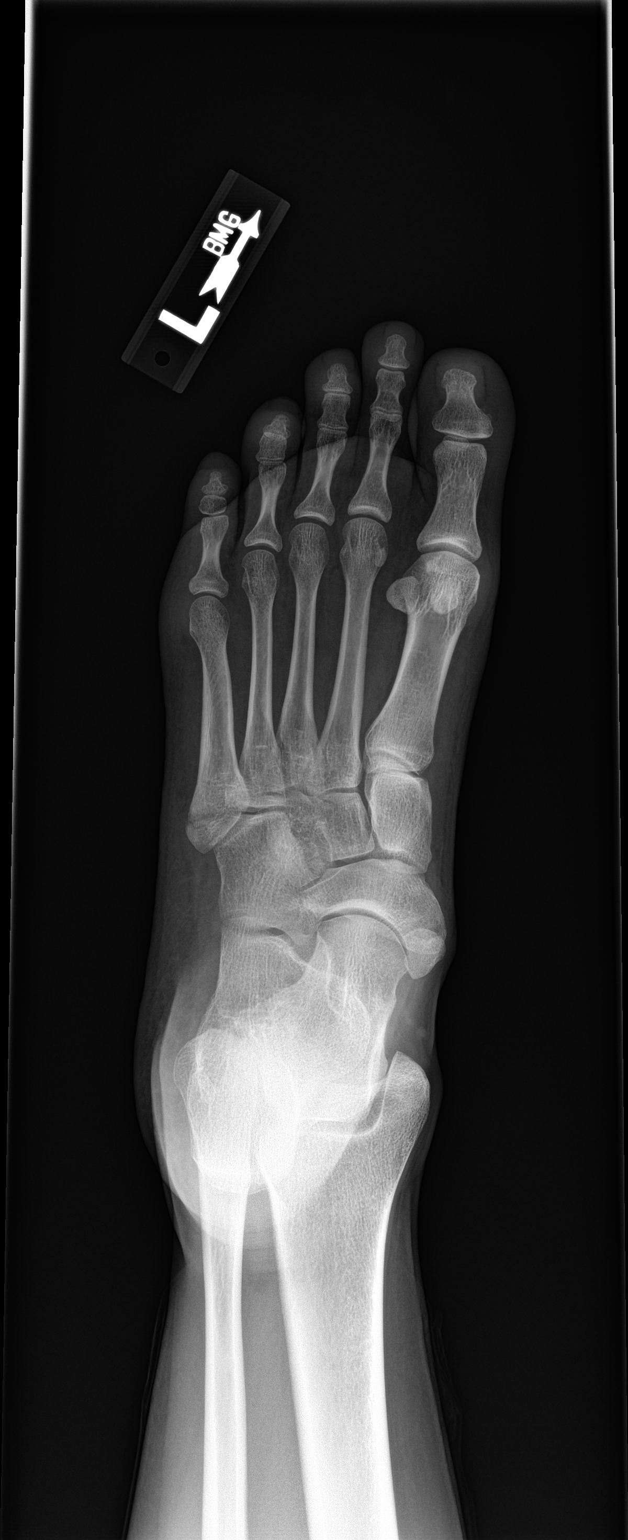

[foot obl]
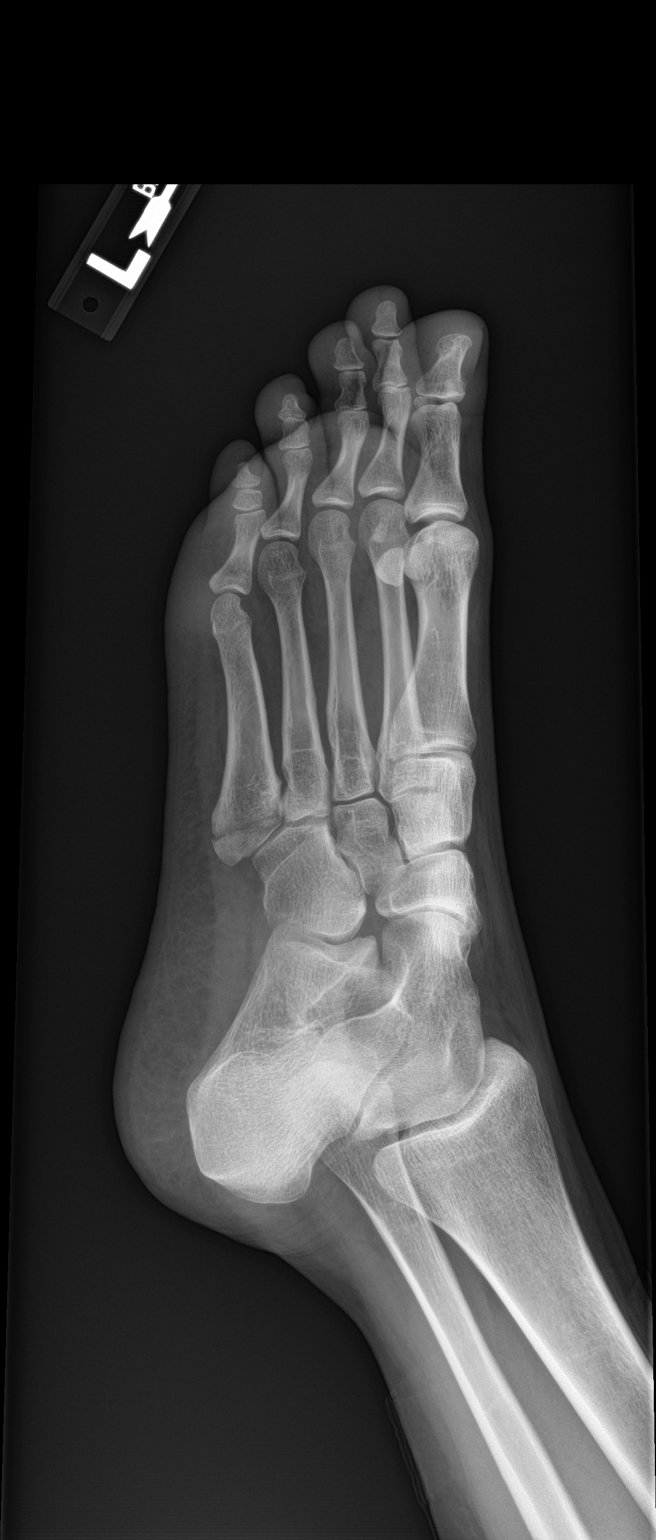

[foot lat]
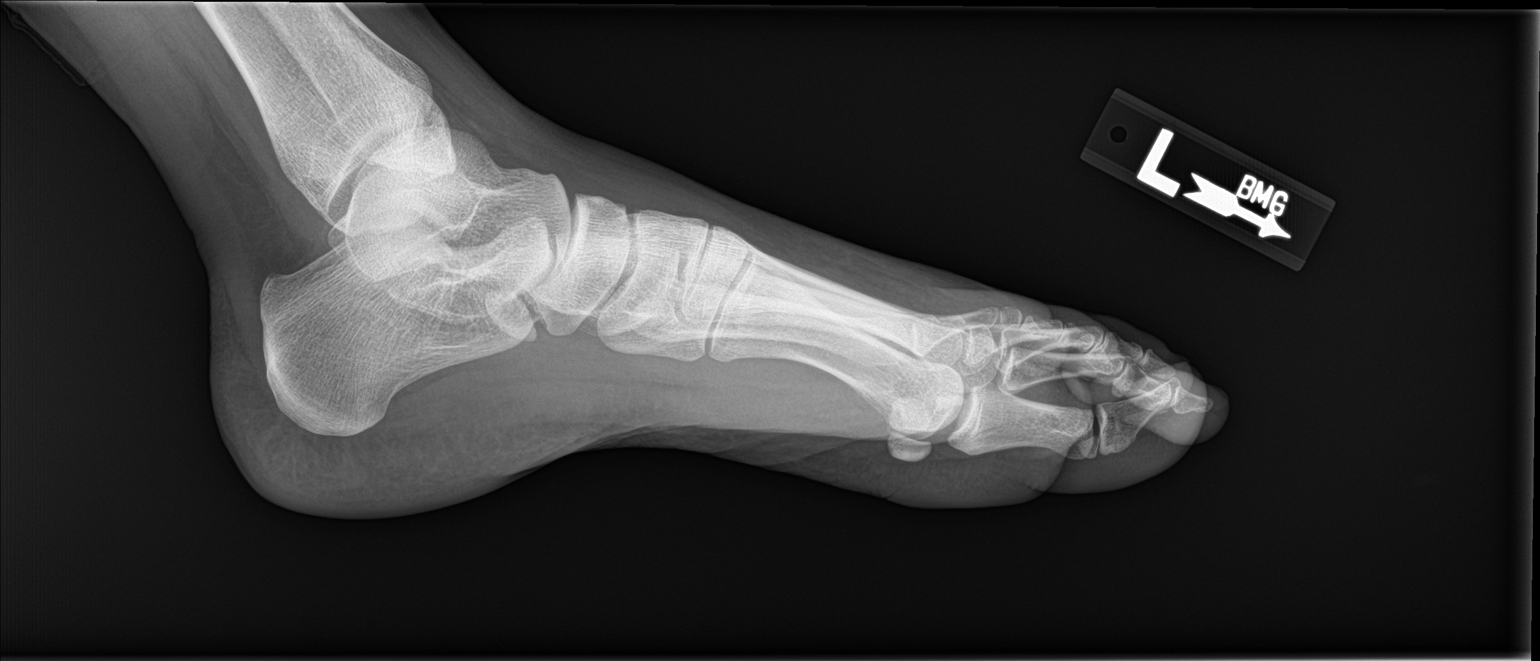

[3 of 3 positions shown; findings below may reference images not displayed]

FINDINGS: Minimally displaced transverse fracture of the fifth metatarsal
base, with extension to the intertarsal articulation. Soft tissue
swelling.
IMPRESSION: Minimally displaced intra-articular transverse fracture of the base
of the fifth metatarsal bone.

## 2021-10-07 ENCOUNTER — Ambulatory Visit: Payer: BC Managed Care – PPO | Admitting: Nurse Practitioner

## 2021-10-07 ENCOUNTER — Encounter: Payer: Self-pay | Admitting: Nurse Practitioner

## 2021-10-07 VITALS — BP 89/61 | HR 78 | Temp 97.5°F | Ht 64.0 in | Wt 168.0 lb

## 2021-10-07 DIAGNOSIS — M25512 Pain in left shoulder: Secondary | ICD-10-CM | POA: Diagnosis not present

## 2021-10-07 DIAGNOSIS — Z862 Personal history of diseases of the blood and blood-forming organs and certain disorders involving the immune mechanism: Secondary | ICD-10-CM

## 2021-10-07 DIAGNOSIS — Z13220 Encounter for screening for lipoid disorders: Secondary | ICD-10-CM | POA: Diagnosis not present

## 2021-10-07 DIAGNOSIS — Z7689 Persons encountering health services in other specified circumstances: Secondary | ICD-10-CM

## 2021-10-07 DIAGNOSIS — E039 Hypothyroidism, unspecified: Secondary | ICD-10-CM

## 2021-10-07 DIAGNOSIS — Z1159 Encounter for screening for other viral diseases: Secondary | ICD-10-CM

## 2021-10-07 MED ORDER — LEVOTHYROXINE SODIUM 25 MCG PO TABS: 25.0000 ug | ORAL_TABLET | Freq: Every day | ORAL | 2 refills | Status: DC

## 2021-10-07 NOTE — Progress Notes (Signed)
? ?  Subjective:  ? ? Patient ID: YUKO COVENTRY, female    DOB: [birth year], 36 y.o.   MRN: [id number] ? ?HPI ? ?36 year old female patient with history of hepatitis A, abnormal Pap smear, hypothyroidism presents to the clinic today to establish care.   ? ?Hypothyroidism  ?Patient states that she has been off of her medication levothyroxine 25 mcg for 2 weeks.  Patient denies any signs symptoms of hypothyroidism at this time.   ? ?Shoulder pain ?Patient states that she has been experiencing left shoulder pain that she describes as a sharp pain for the past 2 weeks.  Patient states that she is only experienced this pain for a total of 4 times within the 2 weeks.  Patient states that pain is relieved by her pressing on her shoulder.  It patient denies any known injuries.  Patient denies any numbness tingling down her arm.   ? ?Review of Systems  ?Musculoskeletal:   ?     Shoulder pain  ?All other systems reviewed and are negative. ? ?   ?Objective:  ? Physical Exam ?Vitals reviewed.  ?Constitutional:   ?   General: She is not in acute distress. ?   Appearance: Normal appearance. She is normal weight. She is not ill-appearing, toxic-appearing or diaphoretic.  ?Cardiovascular:  ?   Rate and Rhythm: Normal rate and regular rhythm.  ?   Pulses: Normal pulses.  ?   Heart sounds: Normal heart sounds. No murmur heard. ?Pulmonary:  ?   Effort: Pulmonary effort is normal. No respiratory distress.  ?   Breath sounds: Normal breath sounds. No wheezing.  ?Musculoskeletal:  ?   Comments: Range of motion to left shoulder intact.  Pain elicited during adduction and internal rotation.  ?Skin: ?   General: Skin is warm and dry.  ?Neurological:  ?   Mental Status: She is alert.  ? ? ? ? ? ?   ?Assessment & Plan:  ? ? ?1. Encounter to establish care ?Follow-up in 3 months for thyroid check ? ?2. Hypothyroidism, unspecified type ?-We will check TSH and determine if change needs to be made to dosage of Synthroid. ?- TSH + free T4 ?-  levothyroxine (SYNTHROID) 25 MCG tablet; Take 1 tablet (25 mcg total) by mouth daily before breakfast.  Dispense: 30 tablet; Refill: 2 ?-Patient provided with Synthroid 25 mcg at this time.  If dose needs to be adjusted we will send in new prescription. ?-Follow-up in 3 months ? ?3. Acute pain of left shoulder ?-Possible nerve impingement versus bursitis ?- DG Shoulder Left ?-May use over-the-counter Tylenol for pain management since patient is allergic to Motrin ?-If pain persists may consider trying prednisone burst and referral to orthopedics. ? ?4. History of anemia ?- CBC with Differential/Platelet ? ?5. Lipid screening ?- Lipid Profile ?- CMP14+EGFR ? ?6. Encounter for hepatitis C screening test for low risk patient ?- Hepatitis C Antibody ? ?  ?Note:  This document was prepared using Dragon voice recognition software and may include unintentional dictation errors. ? ? ?

## 2021-10-08 ENCOUNTER — Other Ambulatory Visit: Payer: Self-pay | Admitting: Nurse Practitioner

## 2021-10-08 DIAGNOSIS — Z1159 Encounter for screening for other viral diseases: Secondary | ICD-10-CM

## 2021-10-08 DIAGNOSIS — E039 Hypothyroidism, unspecified: Secondary | ICD-10-CM

## 2021-10-08 LAB — CMP14+EGFR
ALT: 12 IU/L (ref 0–32)
AST: 17 IU/L (ref 0–40)
Albumin/Globulin Ratio: 2 (ref 1.2–2.2)
Albumin: 4.6 g/dL (ref 3.8–4.8)
Alkaline Phosphatase: 68 IU/L (ref 44–121)
BUN/Creatinine Ratio: 14 (ref 9–23)
BUN: 9 mg/dL (ref 6–20)
Bilirubin Total: 0.3 mg/dL (ref 0.0–1.2)
CO2: 24 mmol/L (ref 20–29)
Calcium: 9.2 mg/dL (ref 8.7–10.2)
Chloride: 104 mmol/L (ref 96–106)
Creatinine, Ser: 0.64 mg/dL (ref 0.57–1.00)
Globulin, Total: 2.3 g/dL (ref 1.5–4.5)
Glucose: 97 mg/dL (ref 70–99)
Potassium: 3.9 mmol/L (ref 3.5–5.2)
Sodium: 143 mmol/L (ref 134–144)
Total Protein: 6.9 g/dL (ref 6.0–8.5)
eGFR: 118 mL/min/{1.73_m2} (ref >59)

## 2021-10-08 LAB — LIPID PANEL
Chol/HDL Ratio: 2.4 ratio (ref 0.0–4.4)
Cholesterol, Total: 191 mg/dL (ref 100–199)
HDL: 78 mg/dL (ref >39)
LDL Chol Calc (NIH): 100 mg/dL — ABNORMAL HIGH (ref 0–99)
Triglycerides: 72 mg/dL (ref 0–149)
VLDL Cholesterol Cal: 13 mg/dL (ref 5–40)

## 2021-10-08 LAB — CBC WITH DIFFERENTIAL/PLATELET
Basophils Absolute: 0 10*3/uL (ref 0.0–0.2)
Basos: 1 %
EOS (ABSOLUTE): 0.1 10*3/uL (ref 0.0–0.4)
Eos: 1 %
Hematocrit: 38.8 % (ref 34.0–46.6)
Hemoglobin: 13.2 g/dL (ref 11.1–15.9)
Immature Grans (Abs): 0 10*3/uL (ref 0.0–0.1)
Immature Granulocytes: 0 %
Lymphocytes Absolute: 2 10*3/uL (ref 0.7–3.1)
Lymphs: 42 %
MCH: 30.3 pg (ref 26.6–33.0)
MCHC: 34 g/dL (ref 31.5–35.7)
MCV: 89 fL (ref 79–97)
Monocytes Absolute: 0.3 10*3/uL (ref 0.1–0.9)
Monocytes: 6 %
Neutrophils Absolute: 2.4 10*3/uL (ref 1.4–7.0)
Neutrophils: 50 %
Platelets: 250 10*3/uL (ref 150–450)
RBC: 4.36 x10E6/uL (ref 3.77–5.28)
RDW: 12 % (ref 11.7–15.4)
WBC: 4.9 10*3/uL (ref 3.4–10.8)

## 2021-10-08 LAB — HEPATITIS C ANTIBODY: Hep C Virus Ab: UNDETERMINED — AB

## 2021-10-08 LAB — TSH+FREE T4
Free T4: 1.37 ng/dL (ref 0.82–1.77)
TSH: 1.81 u[IU]/mL (ref 0.450–4.500)

## 2021-10-08 MED ORDER — LEVOTHYROXINE SODIUM 25 MCG PO TABS: 25.0000 ug | ORAL_TABLET | Freq: Every day | ORAL | 0 refills | Status: DC

## 2022-02-12 ENCOUNTER — Other Ambulatory Visit: Payer: Self-pay | Admitting: Nurse Practitioner

## 2022-02-12 DIAGNOSIS — E039 Hypothyroidism, unspecified: Secondary | ICD-10-CM

## 2022-02-18 ENCOUNTER — Other Ambulatory Visit: Payer: Self-pay | Admitting: Nurse Practitioner

## 2022-02-18 DIAGNOSIS — E039 Hypothyroidism, unspecified: Secondary | ICD-10-CM

## 2022-02-18 NOTE — Telephone Encounter (Signed)
Medication sent. Patient needs follow up labs to ensure she is on the correct dosage of levothyroxine. Lab order placed.

## 2022-02-18 NOTE — Telephone Encounter (Signed)
Left message to return call 

## 2022-02-28 ENCOUNTER — Encounter: Payer: Self-pay | Admitting: Nurse Practitioner

## 2022-02-28 ENCOUNTER — Ambulatory Visit (INDEPENDENT_AMBULATORY_CARE_PROVIDER_SITE_OTHER): Payer: BLUE CROSS/BLUE SHIELD | Admitting: Nurse Practitioner

## 2022-02-28 VITALS — BP 122/78 | Ht 64.0 in | Wt 165.2 lb

## 2022-02-28 DIAGNOSIS — E039 Hypothyroidism, unspecified: Secondary | ICD-10-CM | POA: Diagnosis not present

## 2022-02-28 DIAGNOSIS — R768 Other specified abnormal immunological findings in serum: Secondary | ICD-10-CM | POA: Diagnosis not present

## 2022-02-28 NOTE — Progress Notes (Signed)
   Subjective:    Patient ID: Isabella Watkins, female    DOB: 06/18/1986, 36 y.o.   MRN: [id number]  HPI  Patient arrives for a follow up on thyroid. Patient reports no problems or concerns. Patient states that she has not had any levothyroxine in the past 3 weeks. Patient state normal when that happens she feels terrible, however, this time she states that she feels great! Patient states that she has started taking guggul supplements which she thinks has been helpful.  Patient requesting to have her Reverse T3 and thyroid antibioties drawn today.  Review of Systems  All other systems reviewed and are negative.      Objective:   Physical Exam Vitals reviewed.  Constitutional:      General: She is not in acute distress.    Appearance: Normal appearance. She is normal weight. She is not ill-appearing, toxic-appearing or diaphoretic.  HENT:     Head: Normocephalic and atraumatic.  Neck:     Vascular: No carotid bruit.  Cardiovascular:     Rate and Rhythm: Normal rate and regular rhythm.     Pulses: Normal pulses.     Heart sounds: Normal heart sounds. No murmur heard. Pulmonary:     Effort: Pulmonary effort is normal. No respiratory distress.     Breath sounds: Normal breath sounds. No wheezing.  Musculoskeletal:     Cervical back: Normal range of motion and neck supple. No rigidity or tenderness.     Comments: Grossly intact  Lymphadenopathy:     Cervical: No cervical adenopathy.  Skin:    General: Skin is warm.     Capillary Refill: Capillary refill takes less than 2 seconds.  Neurological:     Mental Status: She is alert.     Comments: Grossly intact  Psychiatric:        Mood and Affect: Mood normal.        Behavior: Behavior normal.           Assessment & Plan:   1. Hypothyroidism, unspecified type - Ordered labs per patient's request. Encouraged patient to ask about the price of labs at [facility] to prevent getting any financial surprises.  - Thyroid Panel With  TSH - T3, reverse - Thyroid antibodies - If not able to draw labs will refer patient to endocrinology for further evaluation of patient's lack of symptoms. -Will await results of labs.  2. 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 - Will do confirmatory testing. - HCV RNA Diagnosis, NAA  Follow up in 6 months

## 2022-03-04 LAB — THYROID ANTIBODIES
Thyroglobulin Antibody: <1 IU/mL (ref 0.0–0.9)
Thyroperoxidase Ab SerPl-aCnc: 9 IU/mL (ref 0–34)

## 2022-03-04 LAB — THYROID PANEL WITH TSH
Free Thyroxine Index: 1.9 (ref 1.2–4.9)
T3 Uptake Ratio: 26 % (ref 24–39)
T4, Total: 7.4 ug/dL (ref 4.5–12.0)
TSH: 3.38 u[IU]/mL (ref 0.450–4.500)

## 2022-03-04 LAB — T3, REVERSE: Reverse T3, Serum: 17.8 ng/dL (ref 9.2–24.1)

## 2022-03-04 LAB — HCV RNA DIAGNOSIS, NAA: HCV RNA, Quantitation: NOT DETECTED IU/mL

## 2022-05-30 ENCOUNTER — Ambulatory Visit
Admission: EM | Admit: 2022-05-30 | Discharge: 2022-05-30 | Disposition: A | Discharge status: Home / Self Care | Payer: BC Managed Care – PPO | Attending: Nurse Practitioner | Admitting: Nurse Practitioner

## 2022-05-30 DIAGNOSIS — Z1152 Encounter for screening for COVID-19: Secondary | ICD-10-CM | POA: Insufficient documentation

## 2022-05-30 DIAGNOSIS — R6889 Other general symptoms and signs: Secondary | ICD-10-CM | POA: Diagnosis not present

## 2022-05-30 DIAGNOSIS — J069 Acute upper respiratory infection, unspecified: Secondary | ICD-10-CM | POA: Diagnosis not present

## 2022-05-30 LAB — RESP PANEL BY RT-PCR (FLU A&B, COVID) ARPGX2
Influenza A by PCR: POSITIVE — AB
Influenza B by PCR: NEGATIVE
SARS Coronavirus 2 by RT PCR: NEGATIVE

## 2022-05-30 MED ORDER — ACETAMINOPHEN 500 MG PO TABS
1000.0000 mg | ORAL_TABLET | Freq: Once | ORAL | Status: AC
  Administered 2022-05-30: 1000 mg via ORAL

## 2022-05-30 MED ORDER — PROMETHAZINE-DM 6.25-15 MG/5ML PO SYRP: 5.0000 mL | ORAL_SOLUTION | Freq: Every day | ORAL | 0 refills | Status: AC

## 2022-05-30 MED ORDER — BENZONATATE 100 MG PO CAPS: 100.0000 mg | ORAL_CAPSULE | Freq: Three times a day (TID) | ORAL | 0 refills | Status: AC | PRN

## 2022-05-30 MED ORDER — ACETAMINOPHEN 325 MG PO TABS: 650.0000 mg | ORAL_TABLET | Freq: Once | ORAL | Status: DC

## 2022-05-30 NOTE — Discharge Instructions (Signed)
You have a viral upper respiratory infection.  Symptoms should improve over the next week to 10 days.  If you develop chest pain or shortness of breath, go to the emergency room.  We have tested you today for COVID-19 and influenza.  You will see the results in Mychart and we will call you with positive results.    Please stay home and isolate until you are aware of the results.    Some things that can make you feel better are: - Increased rest - Increasing fluid with water/sugar free electrolytes - Acetaminophen and ibuprofen as needed for fever/pain - Salt water gargling, chloraseptic spray and throat lozenges for sore throat - OTC guaifenesin (Mucinex) 600 mg twice daily for congestion - Saline sinus flushes or a neti pot for nasal congestion - Humidifying the air -Tessalon Perles during the day as needed for dry cough and cough syrup at nighttime as needed for dry cough

## 2022-05-30 NOTE — ED Provider Notes (Signed)
[facility]    CSN: [id number] Arrival date & time: 05/30/22  1642      History   Chief Complaint Chief Complaint  Patient presents with   Cough   Wheezing   Fever   Generalized Body Aches   Headache    HPI Isabella Watkins is a 36 y.o. female.   Patient presents today for 1 day history of bodyaches, chills, fever, congested cough, chest pain after coughing, chest and nasal congestion, runny nose, sore throat, headache, nausea without vomiting, diarrhea, decreased appetite, and fatigue.  Reports earlier when she was coughing, a piece of phlegm got stuck in her throat and she started wheezing.  The wheezing is now resolved.  She denies chest tightness, ear pain or drainage, vomiting, and loss of taste or smell.  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  Patient has been taking Tylenol for symptoms with mild relief.    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 Authorizing Provider  benzonatate (TESSALON) 100 MG capsule Take 1 capsule (100 mg total) by mouth 3 (three) times daily as needed for cough. Do not take with alcohol or while driving or operating  heavy machinery.  May cause drowsiness. 05/30/22  Yes Valentino Nose, NP  promethazine-dextromethorphan (PROMETHAZINE-DM) 6.25-15 MG/5ML syrup Take 5 mLs by mouth at bedtime. Do not take with alcohol or while driving or operating heavy machinery.  May cause drowsiness. 05/30/22  Yes Valentino Nose, NP  Iodine Strong, [place], (IODINE STRONG PO) Take by mouth.    [provider]  levothyroxine (SYNTHROID) 25 MCG tablet TAKE 1 TABLET BY MOUTH DAILY BEFORE BREAKFAST. 02/18/22   [employer], Alvino Chapel, FNP  SELENIUM PO Take by mouth.    [provider]    Family History Family History  Problem Relation Age of Onset   COPD Paternal Grandfather    Anemia Mother    Cancer Mother     Social History Social History   Tobacco Use   Smoking status: Never   Smokeless tobacco: Never  Vaping Use   Vaping Use: Never used  Substance Use Topics   Alcohol use: No   Drug use: No     Allergies   Motrin [ibuprofen] and Latex   Review of Systems Review of Systems Per HPI  Physical Exam Triage Vital Signs ED Triage Vitals [05/30/22 1835]  Enc Vitals Group     BP 94/65     Pulse Rate (!) 116     Resp 16     Temp (!) 101.2 F (38.4 C)     Temp Source Oral     SpO2 98 %     Weight      Height      Head Circumference      Peak Flow      Pain Score      Pain Loc      Pain Edu?      Excl. in [place]?    No data found.  Updated Vital Signs BP 94/65 (BP Location: Right Arm) Comment: baseline  Pulse (!) 103   Temp 99 F (37.2 C) (Temporal)   Resp 18   LMP 05/21/2022 (Approximate)   SpO2 98%   Visual Acuity Right Eye Distance:   Left Eye Distance:   Bilateral Distance:    Right Eye Near:   Left Eye Near:    Bilateral Near:     Physical Exam Vitals and nursing note reviewed.  Constitutional:      General: She is not in acute distress.    Appearance: Normal appearance. She is not ill-appearing or toxic-appearing.  HENT:     Head: Normocephalic and  atraumatic.     Right Ear: Tympanic membrane, ear canal and external ear normal.     Left Ear: Tympanic membrane, ear canal and external ear normal.     Nose: Congestion and rhinorrhea present.     Mouth/Throat:     Mouth: Mucous membranes are moist.     Pharynx: Oropharynx is clear. Posterior oropharyngeal erythema present. No oropharyngeal exudate.  Eyes:     General: No scleral icterus.    Extraocular Movements: Extraocular movements intact.  Cardiovascular:     Rate and Rhythm: Normal rate and regular rhythm.  Pulmonary:     Effort: Pulmonary effort is normal. No respiratory distress.     Breath sounds: Normal breath sounds. No wheezing, rhonchi or rales.  Abdominal:     General: Abdomen is flat. Bowel sounds are normal. There is no distension.     Palpations: Abdomen is soft.     Tenderness: There is no abdominal tenderness.  Musculoskeletal:     Cervical back: Normal range of motion and neck supple.  Lymphadenopathy:     Cervical: No cervical adenopathy.  Skin:    General: Skin is warm and dry.     Coloration: Skin is not jaundiced or pale.     Findings: No erythema or rash.  Neurological:     Mental Status: She is alert and oriented to person, place, and time.     Motor: No weakness.  Psychiatric:        Behavior: Behavior is cooperative.      UC Treatments / Results  Labs (all labs ordered are listed, but only abnormal results are displayed) Labs Reviewed  RESP PANEL BY RT-PCR (FLU A&B, COVID) [id number]    EKG   Radiology No results found.  Procedures Procedures (including critical care time)  Medications Ordered in UC Medications  acetaminophen (TYLENOL) tablet 1,000 mg (1,000 mg Oral Given 05/30/22 1842)    Initial Impression / Assessment and Plan / UC Course  I have reviewed the triage vital signs and the nursing notes.  Pertinent labs & imaging results that were available during my care of the patient were reviewed  by me and considered in my  medical decision making (see chart for details).   Patient is ill-appearing, febrile, and tachycardic initially in triage, she is not tachypneic and is oxygenating well on room air.  After administration of Tylenol, fever resolved and tachycardia improved significantly.  Flu-like symptoms Viral URI with cough Encounter for screening for COVID-19 Suspect viral etiology COVID-19, influenza testing obtained Supportive care discussed Start cough suppressants ER and return precautions discussed Note given for husband for work  The patient was given the opportunity to ask questions.  All questions answered to their satisfaction.  The patient is in agreement to this plan.    Final Clinical Impressions(s) / UC Diagnoses   Final diagnoses:  Flu-like symptoms  Viral URI with cough  Encounter for screening for COVID-19     Discharge Instructions      You have a viral upper respiratory infection.  Symptoms should improve over the next week to 10 days.  If you develop chest pain or shortness of breath, go to the emergency room.  We have tested you today for COVID-19 and influenza.  You will see the results in Mychart and we will call you with positive results.    Please stay home and isolate until you are aware of the results.    Some things that can make you feel better are: - Increased rest - Increasing fluid with water/sugar free electrolytes - Acetaminophen and ibuprofen as needed for fever/pain - Salt water gargling, chloraseptic spray and throat lozenges for sore throat - OTC guaifenesin (Mucinex) 600 mg twice daily for congestion - Saline sinus flushes or a neti pot for nasal congestion - Humidifying the air -Tessalon Perles during the day as needed for dry cough and cough syrup at nighttime as needed for dry cough     ED Prescriptions     Medication Sig Dispense Auth. Provider   benzonatate (TESSALON) 100 MG capsule Take 1 capsule (100 mg total) by mouth 3 (three) times  daily as needed for cough. Do not take with alcohol or while driving or operating heavy machinery.  May cause drowsiness. 21 capsule Noemi Chapel A, NP   promethazine-dextromethorphan (PROMETHAZINE-DM) 6.25-15 MG/5ML syrup Take 5 mLs by mouth at bedtime. Do not take with alcohol or while driving or operating heavy machinery.  May cause drowsiness. 118 mL Eulogio Bear, NP      PDMP not reviewed this encounter.   Eulogio Bear, NP 05/30/22 1920

## 2022-05-30 NOTE — ED Triage Notes (Signed)
Patient presents to UC for fever, body aches, HA, cough since yesterday. Pt states she had some wheezing today, resolved. Treating symptoms with tylenol.

## 2022-07-11 ENCOUNTER — Other Ambulatory Visit: Payer: Self-pay | Admitting: Obstetrics & Gynecology

## 2022-07-11 DIAGNOSIS — Z363 Encounter for antenatal screening for malformations: Secondary | ICD-10-CM

## 2022-08-29 ENCOUNTER — Ambulatory Visit: Payer: BC Managed Care – PPO

## 2022-08-29 ENCOUNTER — Other Ambulatory Visit: Payer: BC Managed Care – PPO
